# Patient Record
Sex: Female | Born: 1975 | Race: Black or African American | Hispanic: Yes | Smoking: Never smoker
Health system: Southern US, Community
[De-identification: ages and names within clinical notes are randomized; demographics above are authoritative.]

## PROBLEM LIST (undated history)

## (undated) DIAGNOSIS — Z803 Family history of malignant neoplasm of breast: Secondary | ICD-10-CM

## (undated) DIAGNOSIS — T7840XA Allergy, unspecified, initial encounter: Secondary | ICD-10-CM

## (undated) DIAGNOSIS — N6452 Nipple discharge: Secondary | ICD-10-CM

## (undated) DIAGNOSIS — Z8742 Personal history of other diseases of the female genital tract: Secondary | ICD-10-CM

## (undated) DIAGNOSIS — K219 Gastro-esophageal reflux disease without esophagitis: Secondary | ICD-10-CM

## (undated) DIAGNOSIS — N809 Endometriosis, unspecified: Secondary | ICD-10-CM

## (undated) DIAGNOSIS — E785 Hyperlipidemia, unspecified: Secondary | ICD-10-CM

## (undated) DIAGNOSIS — Z9109 Other allergy status, other than to drugs and biological substances: Secondary | ICD-10-CM

## (undated) HISTORY — DX: Family history of malignant neoplasm of breast: Z80.3

## (undated) HISTORY — DX: Allergy, unspecified, initial encounter: T78.40XA

## (undated) HISTORY — PX: BREAST SURGERY: SHX581

## (undated) HISTORY — DX: Gastro-esophageal reflux disease without esophagitis: K21.9

## (undated) HISTORY — DX: Other allergy status, other than to drugs and biological substances: Z91.09

## (undated) HISTORY — DX: Nipple discharge: N64.52

## (undated) HISTORY — DX: Hyperlipidemia, unspecified: E78.5

## (undated) HISTORY — DX: Personal history of other diseases of the female genital tract: Z87.42

## (undated) HISTORY — DX: Endometriosis, unspecified: N80.9

---

## 2004-06-03 ENCOUNTER — Inpatient Hospital Stay: Payer: Self-pay | Admitting: Obstetrics and Gynecology

## 2004-10-20 ENCOUNTER — Emergency Department: Payer: Self-pay | Admitting: Unknown Physician Specialty

## 2004-11-29 ENCOUNTER — Emergency Department: Payer: Self-pay | Admitting: Emergency Medicine

## 2006-05-14 ENCOUNTER — Emergency Department: Payer: Self-pay | Admitting: Emergency Medicine

## 2006-05-14 ENCOUNTER — Other Ambulatory Visit: Payer: Self-pay

## 2007-05-03 ENCOUNTER — Emergency Department: Payer: Self-pay | Admitting: Emergency Medicine

## 2007-08-03 ENCOUNTER — Emergency Department: Payer: Self-pay | Admitting: Emergency Medicine

## 2007-08-07 ENCOUNTER — Emergency Department: Payer: Self-pay | Admitting: Emergency Medicine

## 2007-09-14 ENCOUNTER — Emergency Department: Payer: Self-pay | Admitting: Emergency Medicine

## 2008-03-21 ENCOUNTER — Ambulatory Visit: Payer: Self-pay | Admitting: Internal Medicine

## 2008-05-01 ENCOUNTER — Ambulatory Visit: Payer: Self-pay | Admitting: Internal Medicine

## 2008-08-01 HISTORY — PX: BILATERAL SALPINGOOPHORECTOMY: SHX1223

## 2008-09-10 ENCOUNTER — Ambulatory Visit: Payer: Self-pay | Admitting: Internal Medicine

## 2009-07-15 ENCOUNTER — Ambulatory Visit: Payer: Self-pay

## 2009-07-16 ENCOUNTER — Ambulatory Visit: Payer: Self-pay

## 2009-07-16 HISTORY — PX: LAPAROSCOPIC SUPRACERVICAL HYSTERECTOMY: SUR797

## 2009-11-09 ENCOUNTER — Emergency Department: Payer: Self-pay | Admitting: Emergency Medicine

## 2009-11-11 ENCOUNTER — Emergency Department: Payer: Self-pay | Admitting: Emergency Medicine

## 2011-07-25 ENCOUNTER — Ambulatory Visit: Payer: Self-pay

## 2011-08-02 HISTORY — PX: COLPOSCOPY: SHX161

## 2011-10-14 DIAGNOSIS — Z8742 Personal history of other diseases of the female genital tract: Secondary | ICD-10-CM

## 2011-10-14 HISTORY — DX: Personal history of other diseases of the female genital tract: Z87.42

## 2012-11-26 ENCOUNTER — Ambulatory Visit: Payer: Self-pay

## 2012-12-31 ENCOUNTER — Ambulatory Visit: Payer: Self-pay | Admitting: General Surgery

## 2013-01-02 ENCOUNTER — Ambulatory Visit (INDEPENDENT_AMBULATORY_CARE_PROVIDER_SITE_OTHER): Payer: 59 | Admitting: General Surgery

## 2013-01-02 ENCOUNTER — Encounter: Payer: Self-pay | Admitting: General Surgery

## 2013-01-02 VITALS — BP 118/76 | HR 78 | Resp 12 | Ht 68.0 in | Wt 173.0 lb

## 2013-01-02 DIAGNOSIS — N6459 Other signs and symptoms in breast: Secondary | ICD-10-CM

## 2013-01-02 DIAGNOSIS — N6452 Nipple discharge: Secondary | ICD-10-CM

## 2013-01-02 NOTE — Patient Instructions (Signed)
Return as nneded.

## 2013-01-02 NOTE — Progress Notes (Signed)
Patient ID: Christine Fry, female   DOB: [birth year], 37 y.o.   MRN: [id number]  Chief Complaint  Patient presents with  . Other    mammogram    HPI Christine Fry is a 37 y.o. female here today for bilateral nipple discharge, has been going on for two months now. Patient states the discharge looks dark green.She had her mammogram and ultrasound done at [facility] on April 23,14. No family history of breast cancer. Patient performs self breast checks.  Drainage she's only been noted during manipulation with self-examination.  The patient reports no change in her diet or caffeine consumption. No report of generalized breast discomfort or engorgement. HPI  Past Medical History  Diagnosis Date  . Acid reflux   . Nipple discharge in female 01/03/2013    Past Surgical History  Procedure Laterality Date  . Abdominal hysterectomy  2009  . Cesarean section  2005    History reviewed. No pertinent family history.  Social History History  Substance Use Topics  . Smoking status: Never Smoker   . Smokeless tobacco: Never Used  . Alcohol Use: Yes    Allergies  Allergen Reactions  . Shellfish Allergy Nausea Only  . Sulfa Antibiotics Nausea And Vomiting  . Latex Rash    Current Outpatient Prescriptions  Medication Sig Dispense Refill  . cyclobenzaprine (FLEXERIL) 10 MG tablet Take 10 mg by mouth as needed for muscle spasms.      . fluticasone (VERAMYST) 27.5 MCG/SPRAY nasal spray Place 2 sprays into the nose as needed for rhinitis.      Marland Kitchen omeprazole (PRILOSEC) 20 MG capsule Take 20 mg by mouth as needed.       No current facility-administered medications for this visit.    Review of Systems Review of Systems  Constitutional: Negative.   Respiratory: Negative.   Cardiovascular: Negative.     Blood pressure 118/76, pulse 78, resp. rate 12, height 5\' 8"  (1.727 m), weight 173 lb (78.472 kg).  Physical Exam Physical Exam  Constitutional: She appears well-developed and  well-nourished.  Eyes: Conjunctivae are normal. No scleral icterus.  Neck: Neck supple.  Cardiovascular: Normal rate, regular rhythm and normal heart sounds.   Pulmonary/Chest: Effort normal. Right breast exhibits no inverted nipple, no mass, no nipple discharge, no skin change and no tenderness. Left breast exhibits no inverted nipple, no mass, no nipple discharge, no skin change and no tenderness.  left breast Thickening at 6 o'clock  Lymphadenopathy:    She has no cervical adenopathy.    She has no axillary adenopathy.    Data Reviewed Bilateral mammograms dated 11/26/2012 showed heterogeneously dense parenchymal pattern. No dominant masses or areas of architectural distortion. Ultrasound examination completed at the same time showed no cystic or solid masses in the retroareolar area nor evidence of ductal dilatation. BI-RAD-2.  Focused ultrasound examination of the area of focal thickening in the inferior aspect the left breast was completed. No discernible architectural distortion, cystic or solid areas nor areas of hyperechoic tissue was appreciated. (No images, no charge).  Laboratory studies dated 11/26/2012 showed a normal comprehensive metabolic panel, low TSH of 0.342, low white blood cell count 3200, hemoglobin of 12.7, normal T4 and T3, data urinalysis the  Assessment    Physiologic nipple discharge.    Plan    The patient was discouraged from manipulation of the nipples during her monthly self-exam. Should she have any spontaneous drainage she was encouraged to call for assessment.  Christine Fry 01/03/2013, 11:23 AM

## 2013-01-03 ENCOUNTER — Encounter: Payer: Self-pay | Admitting: General Surgery

## 2013-01-03 DIAGNOSIS — N6452 Nipple discharge: Secondary | ICD-10-CM

## 2013-01-03 HISTORY — DX: Nipple discharge: N64.52

## 2013-01-30 ENCOUNTER — Ambulatory Visit: Payer: Self-pay | Admitting: General Surgery

## 2014-01-20 ENCOUNTER — Ambulatory Visit: Payer: Self-pay

## 2014-06-02 ENCOUNTER — Encounter: Payer: Self-pay | Admitting: General Surgery

## 2014-10-15 ENCOUNTER — Ambulatory Visit: Payer: Self-pay | Admitting: Nurse Practitioner

## 2014-10-29 ENCOUNTER — Ambulatory Visit (INDEPENDENT_AMBULATORY_CARE_PROVIDER_SITE_OTHER): Payer: 59 | Admitting: General Surgery

## 2014-10-29 ENCOUNTER — Encounter: Payer: Self-pay | Admitting: General Surgery

## 2014-10-29 VITALS — BP 118/72 | HR 84 | Resp 16 | Ht 68.0 in | Wt 176.0 lb

## 2014-10-29 DIAGNOSIS — N6452 Nipple discharge: Secondary | ICD-10-CM | POA: Diagnosis not present

## 2014-10-29 NOTE — Progress Notes (Signed)
Patient ID: Christine Fry, female   DOB: 21-Nov-1975, 39 y.o.   MRN: [id number]  Chief Complaint  Patient presents with  . Other    mammogram and breast pain    HPI Christine Fry is a 39 y.o. female.  who presents for a breast evaluation. The most recent mammogram was done on 10-15-14.  She was seen in June, 2014 for the same situation. Nipple discharge from right breast is clear and left breast is dark color. Some tenderness and "full" feeling. Occasionally breast pain in the left breast that is described as sharpe and brief.    No family history of breast cancer. Patient performs self breast checks. Drainage she's only been noted during manipulation with self-examination.    HPI  Past Medical History  Diagnosis Date  . Acid reflux   . Nipple discharge in female 01/03/2013    Past Surgical History  Procedure Laterality Date  . Abdominal hysterectomy  2009  . Cesarean section  2005    No family history on file.  Social History History  Substance Use Topics  . Smoking status: Never Smoker   . Smokeless tobacco: Never Used  . Alcohol Use: Yes    Allergies  Allergen Reactions  . Shellfish Allergy Nausea Only  . Sulfa Antibiotics Nausea And Vomiting  . Latex Rash    Current Outpatient Prescriptions  Medication Sig Dispense Refill  . cyclobenzaprine (FLEXERIL) 10 MG tablet Take 10 mg by mouth as needed for muscle spasms.    . fluticasone (VERAMYST) 27.5 MCG/SPRAY nasal spray Place 2 sprays into the nose as needed for rhinitis.    Marland Kitchen omeprazole (PRILOSEC) 20 MG capsule Take 20 mg by mouth as needed.     No current facility-administered medications for this visit.    Review of Systems Review of Systems  Constitutional: Negative.   Respiratory: Negative.   Cardiovascular: Negative.     Blood pressure 118/72, pulse 84, resp. rate 16, height  (1.727 m), weight 176 lb (79.833 kg).  Physical Exam Physical Exam  Constitutional: She is oriented to person, place, and  time. She appears well-developed and well-nourished.  Eyes: Conjunctivae are normal. No scleral icterus.  Neck: Neck supple.  Cardiovascular: Normal rate, regular rhythm and normal heart sounds.   Pulmonary/Chest: Effort normal and breath sounds normal. Right breast exhibits nipple discharge ( single duct drainage clean). Right breast exhibits no inverted nipple, no mass, no skin change and no tenderness. Left breast exhibits nipple discharge ( single area 1'oclock at the base on the nipple. single area on nipple dark green ). Left breast exhibits no inverted nipple, no mass, no skin change and no tenderness. Breasts are asymmetrical (right breast 1/2 cup size bigger than left breast).    Lymphadenopathy:    She has no cervical adenopathy.    She has no axillary adenopathy.  Neurological: She is alert and oriented to person, place, and time.  Skin: Skin is warm and dry.    Data Reviewed Mammogram dated 10/15/2014 was reviewed. No mammographic abnormality appreciated. BI-RADS-1. The radiologist describes eliciting green drainage from both nipples. This was not the case on today's exam nor on past exams in this office.  PCP notes of March 7 and 22, 2016 were reviewed. She was treated with a course of oral Keflex at the time of the 12/06/2014 visit.  Assessment    Symptomatic left nipple drainage, physiologic. Scant right nipple drainage not requiring intervention.    Plan    Discussed  left breast duct removal. The potential for numbness in the nipple as well as loss of normal erectile function was reviewed. Options for management of the drainage include continued observation versus duct excision. Excision would be completed under anesthesia as an outpatient. The patient desires to proceed with excision.  We'll plan to remove the area of drainage at the base the nipple at the 1:00 position as well as the offending duct.  Patient's surgery has been scheduled for 11-14-14 at [facility].     PCP:   Clyda HurdleKhan, Fozia M   Jd Mccaster W 10/31/2014, 11:55 AM

## 2014-10-29 NOTE — Patient Instructions (Addendum)
Patient to have a left breast duct removal.  Patient's surgery has [facility] for [facility].

## 2014-10-31 ENCOUNTER — Other Ambulatory Visit: Payer: Self-pay | Admitting: General Surgery

## 2014-10-31 DIAGNOSIS — N6452 Nipple discharge: Secondary | ICD-10-CM

## 2014-11-14 ENCOUNTER — Ambulatory Visit
Admit: 2014-11-14 | Disposition: A | Discharge status: Home / Self Care | Payer: Self-pay | Attending: General Surgery | Admitting: General Surgery

## 2014-11-14 DIAGNOSIS — N63 Unspecified lump in breast: Secondary | ICD-10-CM | POA: Diagnosis not present

## 2014-11-14 DIAGNOSIS — N6452 Nipple discharge: Secondary | ICD-10-CM | POA: Diagnosis not present

## 2014-11-14 HISTORY — PX: BREAST SURGERY: SHX581

## 2014-11-17 ENCOUNTER — Encounter: Payer: Self-pay | Admitting: General Surgery

## 2014-11-18 ENCOUNTER — Telehealth: Payer: Self-pay | Admitting: General Surgery

## 2014-11-18 ENCOUNTER — Encounter: Payer: Self-pay | Admitting: General Surgery

## 2014-11-18 NOTE — Telephone Encounter (Signed)
Notified path benign. 

## 2014-11-24 ENCOUNTER — Encounter: Payer: Self-pay | Admitting: General Surgery

## 2014-11-24 ENCOUNTER — Ambulatory Visit (INDEPENDENT_AMBULATORY_CARE_PROVIDER_SITE_OTHER): Payer: 59 | Admitting: General Surgery

## 2014-11-24 VITALS — BP 130/80 | HR 77 | Resp 14 | Ht 68.0 in | Wt 173.0 lb

## 2014-11-24 DIAGNOSIS — N6452 Nipple discharge: Secondary | ICD-10-CM

## 2014-11-24 LAB — SURGICAL PATHOLOGY

## 2014-11-24 NOTE — Patient Instructions (Signed)
May use heat for comfort.

## 2014-11-24 NOTE — Progress Notes (Signed)
Patient ID: Christine Fry, female   DOB: 1975/12/22, 39 y.o.   MRN: [id number]  Chief Complaint  Patient presents with  . Routine Post Op    left breast excision    HPI Christine Fry is a 39 y.o. female here today for her post op left breast excision done on 11/14/14. Patient states she is doing well but having pulsing pain at times and she has been using Ibuprofen for this.  HPI  Past Medical History  Diagnosis Date  . Acid reflux   . Nipple discharge in female 01/03/2013    Past Surgical History  Procedure Laterality Date  . Abdominal hysterectomy  2009  . Cesarean section  2005  . Breast surgery Left 11/14/14    excision    No family history on file.  Social History History  Substance Use Topics  . Smoking status: Never Smoker   . Smokeless tobacco: Never Used  . Alcohol Use: Yes    Allergies  Allergen Reactions  . Shellfish Allergy Nausea Only  . Sulfa Antibiotics Nausea And Vomiting  . Latex Rash    Current Outpatient Prescriptions  Medication Sig Dispense Refill  . cyclobenzaprine (FLEXERIL) 10 MG tablet Take 10 mg by mouth as needed for muscle spasms.    . fluticasone (VERAMYST) 27.5 MCG/SPRAY nasal spray Place 2 sprays into the nose as needed for rhinitis.    Marland Kitchen. omeprazole (PRILOSEC) 20 MG capsule Take 20 mg by mouth as needed.     No current facility-administered medications for this visit.    Review of Systems Review of Systems  Constitutional: Negative.   Respiratory: Negative.   Cardiovascular: Negative.     Blood pressure 130/80, pulse 77, resp. rate 14, height 5\' 8"  (1.727 m), weight 173 lb (78.472 kg).  Physical Exam Physical Exam  Constitutional: She is oriented to person, place, and time. She appears well-developed and well-nourished.  Neck: Neck supple.  Cardiovascular: Normal rate and normal heart sounds.   Pulmonary/Chest: Effort normal and breath sounds normal.    Lymphadenopathy:    She has no cervical adenopathy.  Neurological:  She is alert and oriented to person, place, and time.  Skin: Skin is warm and dry.    Data Reviewed Pathology of the resected retroareolar ductal tissue showed benign breast tissue with fibrocystic changes. Clinically noted were multiple dilated ducts up to a millimeter in diameter.  The skin cyst from the areola showed a sebaceous gland with mild dermal chronic inflammation. No cyst was identified. No malignancy.  Assessment    Doing well status post excision of the left breast retroareolar ductal structures for chronic drainage. Mild firmness consistent with surgical dissection. Good preservation of nipple height.    Plan    Local heat to help resolve the residual thickening has been recommended. She is encouraged to wear a bra at all times until the breast no longer feels "heavy". We'll defer decision on contralateral breast duct excision until a follow-up exam in 2 months. He may resume her exercise program as she is comfortable.     PCP:  Clyda HurdleKhan, Fozia M  Lilias Lorensen W 11/24/2014, 8:51 PM

## 2014-11-30 NOTE — Op Note (Signed)
PATIENT NAME:  Christine Fry, Christine Fry MR#:  [id number] DATE OF BIRTH:  Sep 16, 1975  DATE OF PROCEDURE:  11/14/2014  PREOPERATIVE DIAGNOSIS: Left breast nipple drainage, drainage from accessory gland of Montgomery of the left areola.   POSTOPERATIVE DIAGNOSIS: Left breast nipple drainage, drainage from accessory gland of Montgomery of the left areola.   OPERATIVE PROCEDURE: 1.  Excision of left retroareolar ductal tissue.  2.  Excision of left accessory gland of Montgomery at the 1 o'clock position of the left areola.   SURGEON: Earline MayotteJeffrey W. Yarima Penman, MD    ANESTHESIA: General by LMA, Marcaine 0.5% with 1:200,000 units of epinephrine, 30 mL local infiltration.   CLINICAL NOTE: This 39 year old woman has had intermittent drainage from the left breast which she has found troublesome. Examination showed green drainage from the nipple at the 4 o'clock position. Also noted was drainage from an inflamed superficial skin lesion thought to represent an accessory gland of Montgomery. She was admitted for elective excision of the retroareolar ductal tissue. The patient received Kefzol prior to the procedure.   OPERATIVE NOTE: With the patient under adequate general anesthesia, the breast was prepped with ChloraPrep and draped. Marcaine was infiltrated for postoperative analgesia. An incision was made from the 3 to 9 o'clock position along the edge of the areola. The skin was incised sharply and the remaining dissection completed with electrocautery. The retroareolar tissue was removed including that in the base of the nipple. This was tagged with a skin marker. The dissection included a 3 x 3 x 3 cm block of tissue, which showed chronic inflammatory changes. Several fairly prominent ducts of 2 to 3 mm were noted. No dilated ducts were appreciated at the level of resection. The deep tissue was approximated with interrupted 2-0 Vicryl figure-of-eight sutures in multiple layers. This was done to reconstruct the retroareolar  breast volume.   The area of drainage from the glandular tissue on the surface of the areola was excised with an elliptical incision. This was sent for routine histology. This defect was closed with interrupted 5-0 nylon sutures. The skin of the primary incision for retroareolar tissue excision was closed with a running 4-0 Vicryl subcuticular suture. Benzoin and Steri-Strips were applied. The breast was carefully padded to minimize pressure on the nipple and a compressive wrap applied. The patient tolerated the procedure well and was taken to the recovery room in stable condition.    ____________________________ Earline MayotteJeffrey W. Keldric Poyer, MD jwb:at D: 11/15/2014 09:29:00 ET T: 11/15/2014 14:16:59 ET JOB#: [id number]  cc: Earline MayotteJeffrey W. Chrysta Fulcher, MD, <Dictator> Oliver HumHeather Hiles, NP Omkar Stratmann Brion AlimentW Punam Broussard MD ELECTRONICALLY SIGNED 11/17/2014 9:52

## 2015-01-02 ENCOUNTER — Telehealth: Payer: Self-pay | Admitting: *Deleted

## 2015-01-02 NOTE — Telephone Encounter (Signed)
Patient had surgery on 11/14/14 and her LT Breast still has a little pain. She described it as a "heartbeat" and it comes and goes. She was wanting to know if this was normal after surgery.

## 2015-01-02 NOTE — Telephone Encounter (Signed)
Please notify the patient this is normal. She should be having a follow-up visit in the next 2-4 weeks we'll review at that time.

## 2015-01-27 ENCOUNTER — Ambulatory Visit (INDEPENDENT_AMBULATORY_CARE_PROVIDER_SITE_OTHER): Payer: 59 | Admitting: General Surgery

## 2015-01-27 ENCOUNTER — Encounter: Payer: Self-pay | Admitting: General Surgery

## 2015-01-27 VITALS — BP 128/74 | HR 80 | Resp 12 | Ht 68.0 in | Wt 179.0 lb

## 2015-01-27 DIAGNOSIS — N6452 Nipple discharge: Secondary | ICD-10-CM

## 2015-01-27 NOTE — Progress Notes (Signed)
Patient ID: Christine Fry, female   DOB: 12/14/1975, 39 y.o.   MRN: [id number]  Chief Complaint  Patient presents with  . Follow-up    left breast excision     HPI Christine Fry is a 39 y.o. female here today for her two month follow up left breast excision done on 11/14/14. Patient states she is doing well.  HPI  Past Medical History  Diagnosis Date  . Acid reflux   . Nipple discharge in female 01/03/2013    Past Surgical History  Procedure Laterality Date  . Abdominal hysterectomy  2009  . Cesarean section  2005  . Breast surgery Left 11/14/14    excision    No family history on file.  Social History History  Substance Use Topics  . Smoking status: Never Smoker   . Smokeless tobacco: Never Used  . Alcohol Use: Yes    Allergies  Allergen Reactions  . Shellfish Allergy Nausea Only  . Sulfa Antibiotics Nausea And Vomiting  . Latex Rash    Current Outpatient Prescriptions  Medication Sig Dispense Refill  . cyclobenzaprine (FLEXERIL) 10 MG tablet Take 10 mg by mouth as needed for muscle spasms.     No current facility-administered medications for this visit.    Review of Systems Review of Systems  Constitutional: Negative.   Respiratory: Negative.   Cardiovascular: Negative.     Blood pressure 128/74, pulse 80, resp. rate 12, height 5\' 8"  (1.727 m), weight 179 lb (81.194 kg).  Physical Exam Physical Exam  Constitutional: She is oriented to person, place, and time. She appears well-developed and well-nourished.  HENT:  Mouth/Throat: Oropharynx is clear and moist.  Pulmonary/Chest:    Minimal nipple height loss. Well healed incision.  Neurological: She is alert and oriented to person, place, and time.  Skin: Skin is warm and dry.    Data Reviewed Pathology of the resected tissue showed fibrocystic changes without evidence of malignancy.  Assessment    Doing well status post excision of retroareolar ductal tissue. No recurrent drainage.    Plan     The patient will notify the office of any concerns.     Follow up as needed. Continue self breast exams. Call office for any new breast issues or concerns.  PCP:  Clyda HurdleKhan, Fozia M  Marwin Primmer W 01/28/2015, 7:09 PM

## 2015-01-27 NOTE — Patient Instructions (Signed)
Continue self breast exams. Call office for any new breast issues or concerns. 

## 2015-07-19 ENCOUNTER — Emergency Department
Admission: EM | Admit: 2015-07-19 | Discharge: 2015-07-19 | Disposition: A | Discharge status: Home / Self Care | Payer: 59 | Attending: Student | Admitting: Student

## 2015-07-19 ENCOUNTER — Encounter: Payer: Self-pay | Admitting: Emergency Medicine

## 2015-07-19 DIAGNOSIS — Z91040 Latex allergy status: Secondary | ICD-10-CM | POA: Insufficient documentation

## 2015-07-19 DIAGNOSIS — K0889 Other specified disorders of teeth and supporting structures: Secondary | ICD-10-CM | POA: Diagnosis not present

## 2015-07-19 MED ORDER — HYDROCODONE-ACETAMINOPHEN 5-325 MG PO TABS
1.0000 | ORAL_TABLET | ORAL | Status: DC | PRN
Start: 2015-07-19 — End: 2017-10-06

## 2015-07-19 MED ORDER — AMOXICILLIN 500 MG PO TABS: 500.0000 mg | ORAL_TABLET | Freq: Three times a day (TID) | ORAL | Status: DC

## 2015-07-19 MED ORDER — ONDANSETRON HCL 4 MG PO TABS: 4.0000 mg | ORAL_TABLET | Freq: Three times a day (TID) | ORAL | Status: DC | PRN

## 2015-07-19 NOTE — ED Notes (Signed)
Pt presents to Ed with dental pain on left side. Pt states " I can't wait until Tuesday" to see her dentist.

## 2015-07-19 NOTE — ED Provider Notes (Signed)
[facility] Emergency Department Provider Note ____________________________________________  Time seen: Approximately 10:52 AM  I have reviewed the triage vital signs and the nursing notes.   HISTORY  Chief Complaint Dental Pain   HPI Christine Fry is a 39 y.o. female who presents to the emergency department for evaluation of dental pain. She states that she had a crown placed on her left lower molar in March. The past few days she has had increasing pain in the gum and left lower jaw, but doesn't see any crack or chip on the crown. She states that her dentist isn't open until Tuesday and the pain is too intense to wait. She has been taking Advil without relief.   Past Medical History  Diagnosis Date  . Acid reflux   . Nipple discharge in female 01/03/2013    Patient Active Problem List   Diagnosis Date Noted  . Nipple discharge in female 01/03/2013    Past Surgical History  Procedure Laterality Date  . Abdominal hysterectomy  2009  . Cesarean section  2005  . Breast surgery Left 11/14/14    excision    Current Outpatient Rx  Name  Route  Sig  Dispense  Refill  . amoxicillin (AMOXIL) 500 MG tablet   Oral   Take 1 tablet (500 mg total) by mouth 3 (three) times daily.   30 tablet   0   . cyclobenzaprine (FLEXERIL) 10 MG tablet   Oral   Take 10 mg by mouth as needed for muscle spasms.         Marland Kitchen. HYDROcodone-acetaminophen (NORCO/VICODIN) 5-325 MG tablet   Oral   Take 1 tablet by mouth every 4 (four) hours as needed.   8 tablet   0     Allergies Shellfish allergy; Sulfa antibiotics; and Latex  History reviewed. No pertinent family history.  Social History Social History  Substance Use Topics  . Smoking status: Never Smoker   . Smokeless tobacco: Never Used  . Alcohol Use: Yes    Review of Systems Constitutional: No fever/chills Eyes: No visual changes. ENT: No sore throat. Cardiovascular: Denies chest pain. Respiratory:  Denies shortness of breath. Gastrointestinal: No abdominal pain.  No nausea, no vomiting.  Genitourinary: Negative for dysuria. Musculoskeletal: Negative for back pain. Skin: Negative for rash. Neurological: Negative for headaches, focal weakness or numbness. 10-point ROS otherwise negative.  ____________________________________________   PHYSICAL EXAM:  VITAL SIGNS: ED Triage Vitals  Enc Vitals Group     BP 07/19/15 1009 128/71 mmHg     Pulse Rate 07/19/15 1009 100     Resp 07/19/15 1009 18     Temp 07/19/15 1009 98.5 F (36.9 C)     Temp Source 07/19/15 1009 Oral     SpO2 07/19/15 1009 99 %     Weight 07/19/15 1009 172 lb (78.019 kg)     Height 07/19/15 1009 5\' 6"  (1.676 m)     Head Cir --      Peak Flow --      Pain Score 07/19/15 1010 10     Pain Loc --      Pain Edu? --      Excl. in GC? --     Constitutional: Alert and oriented. Well appearing and in no acute distress. Eyes: Conjunctivae are normal. PERRL. EOMI. Head: Atraumatic. Nose: No congestion/rhinnorhea. Mouth/Throat: Mucous membranes are moist.  Oropharynx non-erythematous. Periodontal Exam    Neck: No stridor.  Hematological/Lymphatic/Immunilogical: No cervical lymphadenopathy. Cardiovascular:   Good  peripheral circulation. Respiratory: Normal respiratory effort.  No retractions. Musculoskeletal: No lower extremity tenderness nor edema.  No joint effusions. Neurologic:  Normal speech and language. No gross focal neurologic deficits are appreciated. Speech is normal. No gait instability. Skin:  Skin is warm, dry and intact. No rash noted. Psychiatric: Mood and affect are normal. Speech and behavior are normal.  ____________________________________________   LABS (all labs ordered are listed, but only abnormal results are displayed)  Labs Reviewed - No data to display ____________________________________________   RADIOLOGY  Not  indicated ____________________________________________   PROCEDURES  Procedure(s) performed: None  Critical Care performed: No  ____________________________________________   INITIAL IMPRESSION / ASSESSMENT AND PLAN / ED COURSE  Pertinent labs & imaging results that were available during my care of the patient were reviewed by me and considered in my medical decision making (see chart for details).  Patient was advised to see the dentist within 14 days. Also advised to take the antibiotic until finished. Instructed to return to the ER for symptoms that change or worsen if you are unable to schedule an appointment. ____________________________________________   FINAL CLINICAL IMPRESSION(S) / ED DIAGNOSES  Final diagnoses:  Pain, dental       Chinita Pester, FNP 07/19/15 1129  Gayla Doss, MD 07/19/15 1616

## 2015-07-19 NOTE — ED Notes (Signed)
Pt reports that she has dental pain that is too severe to wait for her dentist to open on Tuesday. Pt reports that she had a crown placed on that tooth in March, and doesn't think she has a cavity in it. Pt alert & oriented with NAD noted.

## 2016-07-10 ENCOUNTER — Emergency Department
Admission: EM | Admit: 2016-07-10 | Discharge: 2016-07-10 | Disposition: A | Discharge status: Home / Self Care | Payer: 59 | Attending: Emergency Medicine | Admitting: Emergency Medicine

## 2016-07-10 ENCOUNTER — Emergency Department: Payer: 59

## 2016-07-10 ENCOUNTER — Encounter: Payer: Self-pay | Admitting: Medical Oncology

## 2016-07-10 DIAGNOSIS — Z91040 Latex allergy status: Secondary | ICD-10-CM | POA: Diagnosis not present

## 2016-07-10 DIAGNOSIS — Y92009 Unspecified place in unspecified non-institutional (private) residence as the place of occurrence of the external cause: Secondary | ICD-10-CM | POA: Insufficient documentation

## 2016-07-10 DIAGNOSIS — W001XXA Fall from stairs and steps due to ice and snow, initial encounter: Secondary | ICD-10-CM | POA: Diagnosis not present

## 2016-07-10 DIAGNOSIS — M79602 Pain in left arm: Secondary | ICD-10-CM | POA: Diagnosis not present

## 2016-07-10 DIAGNOSIS — W19XXXA Unspecified fall, initial encounter: Secondary | ICD-10-CM

## 2016-07-10 DIAGNOSIS — S4992XA Unspecified injury of left shoulder and upper arm, initial encounter: Secondary | ICD-10-CM | POA: Diagnosis present

## 2016-07-10 DIAGNOSIS — Y9389 Activity, other specified: Secondary | ICD-10-CM | POA: Diagnosis not present

## 2016-07-10 DIAGNOSIS — Y999 Unspecified external cause status: Secondary | ICD-10-CM | POA: Insufficient documentation

## 2016-07-10 MED ORDER — MELOXICAM 7.5 MG PO TABS: 7.5000 mg | ORAL_TABLET | Freq: Every day | ORAL | 2 refills | Status: AC

## 2016-07-10 NOTE — ED Provider Notes (Signed)
[facility] Emergency Department Provider Note ____________________________________________  Time seen: Approximately 9:26 AM  I have reviewed the triage vital signs and the nursing notes.   HISTORY  Chief Complaint Fall and Arm Pain    HPI Christine Fry is a 40 y.o. female presenting with left elbow pain. Patient states that she tripped and slipped on ice last night and fell on her left elbow. Patient did not hit her head or lose consciousness. Patient states that she experiences pain with supination. She currently rates acute, aching pain at 8/10 in intensity. She has taken ibuprofen and applied ice, which has provided limited relief. She is presenting to the emergency department to rule out possible fractures. She denies prior incidences of trauma or surgeries to the left upper extremity.  Past Medical History:  Diagnosis Date  . Acid reflux   . Nipple discharge in female 01/03/2013    Patient Active Problem List   Diagnosis Date Noted  . Nipple discharge in female 01/03/2013    Past Surgical History:  Procedure Laterality Date  . ABDOMINAL HYSTERECTOMY  2009  . BREAST SURGERY Left 11/14/14   excision  . CESAREAN SECTION  2005    Prior to Admission medications   Medication Sig Start Date End Date Taking? Authorizing Provider  amoxicillin (AMOXIL) 500 MG tablet Take 1 tablet (500 mg total) by mouth 3 (three) times daily. 07/19/15   Chinita Pesterari B Triplett, FNP  cyclobenzaprine (FLEXERIL) 10 MG tablet Take 10 mg by mouth as needed for muscle spasms.    Historical Provider, MD  HYDROcodone-acetaminophen (NORCO/VICODIN) 5-325 MG tablet Take 1 tablet by mouth every 4 (four) hours as needed. 07/19/15   Chinita Pesterari B Triplett, FNP  meloxicam (MOBIC) 7.5 MG tablet Take 1 tablet (7.5 mg total) by mouth daily. 07/10/16 07/10/17  Orvil FeilJaclyn M Arianie Couse, PA-C  ondansetron (ZOFRAN) 4 MG tablet Take 1 tablet (4 mg total) by mouth every 8 (eight) hours as needed for nausea or vomiting.  07/19/15   Chinita Pesterari B Triplett, FNP    Allergies Shellfish allergy; Sulfa antibiotics; and Latex  No family history on file.  Social History Social History  Substance Use Topics  . Smoking status: Never Smoker  . Smokeless tobacco: Never Used  . Alcohol use Yes    Review of Systems Constitutional: No recent illness. Cardiovascular: Denies chest pain or palpitations. Respiratory: Denies shortness of breath. Musculoskeletal: Pain in left arm.  Skin: Negative for rash, wound, lesion. Neurological: Negative for focal weakness or numbness.  ____________________________________________   PHYSICAL EXAM:  VITAL SIGNS: ED Triage Vitals  Enc Vitals Group     BP 07/10/16 0901 103/65     Pulse Rate 07/10/16 0901 84     Resp 07/10/16 0901 17     Temp 07/10/16 0901 98 F (36.7 C)     Temp Source 07/10/16 0901 Oral     SpO2 07/10/16 0901 100 %     Weight 07/10/16 0853 172 lb (78 kg)     Height --      Head Circumference --      Peak Flow --      Pain Score 07/10/16 0853 8     Pain Loc --      Pain Edu? --      Excl. in GC? --     Constitutional: Alert and oriented. Well appearing and in no acute distress. Head: Atraumatic. Neck: No stridor.  Respiratory: Normal respiratory effort.   Musculoskeletal: Patient has 5/5 strength in the  upper extremities bilaterally. Full range of motion at the shoulder, elbow and wrist bilaterally. Reflexes are 2+ and symmetric. Palpable radial and ulnar pulses bilaterally.  Neurologic:  Normal speech and language. No gross focal neurologic deficits are appreciated. Speech is normal. No gait instability. Skin:  Skin is warm, dry and intact. Atraumatic. No bruising visualized. Psychiatric: Mood and affect are normal. Speech and behavior are normal.  ____________________________________________   LABS (all labs ordered are listed, but only abnormal results are displayed)  Labs Reviewed - No data to  display ____________________________________________  RADIOLOGY  DG Elbow Complete   I, Orvil FeilJaclyn M Arcadio Cope, personally viewed and evaluated these images (plain radiographs) as part of my medical decision making, as well as reviewing the written report by the radiologist.  No fractures or dislocations visualized.   PROCEDURES  Procedure(s) performed: None     INITIAL IMPRESSION / ASSESSMENT AND PLAN / ED COURSE  Clinical Course     Pertinent labs & imaging results that were available during my care of the patient were reviewed by me and considered in my medical decision making (see chart for details).  Assessment and Plan  Patient presents with left arm pain after slipping and falling on her left elbow last night. Musculoskeletal and neurologic physical exam components were within the parameters are normal. Patient underwent 2 physical exams during his emergency department encounter. Both physical exams were consistent. DG elbow complete reveals no fractures or dislocations. Rest, ice, and compression were encouraged. Patient was prescribed Mobic to be used as needed for pain and inflammation. A referral was made to the orthopedist on-call, Dr. Martha ClanKrasinski. She was advised to make an appointment if left elbow pain persists. All patient questions were answered.  FINAL CLINICAL IMPRESSION(S) / ED DIAGNOSES  Final diagnoses:  Fall, initial encounter       Orvil FeilJaclyn M Krystie Leiter, PA-C 07/10/16 1113    Sharyn CreamerMark Quale, MD 07/10/16 1426

## 2016-07-10 NOTE — ED Notes (Signed)
See triage note  States she fell last pm while taking care of son  Landed with left arm hyperextended   Min swelling noted to lateral elbow

## 2016-07-10 NOTE — ED Triage Notes (Signed)
Pt slipped on ice and fell last night, c/o left arm pain.

## 2017-10-06 ENCOUNTER — Ambulatory Visit (INDEPENDENT_AMBULATORY_CARE_PROVIDER_SITE_OTHER): Payer: 59 | Admitting: Certified Nurse Midwife

## 2017-10-06 ENCOUNTER — Encounter: Payer: Self-pay | Admitting: Certified Nurse Midwife

## 2017-10-06 VITALS — BP 110/66 | HR 81 | Ht 68.0 in | Wt 183.0 lb

## 2017-10-06 DIAGNOSIS — Z13220 Encounter for screening for lipoid disorders: Secondary | ICD-10-CM

## 2017-10-06 DIAGNOSIS — Z124 Encounter for screening for malignant neoplasm of cervix: Secondary | ICD-10-CM

## 2017-10-06 DIAGNOSIS — R61 Generalized hyperhidrosis: Secondary | ICD-10-CM

## 2017-10-06 DIAGNOSIS — E8941 Symptomatic postprocedural ovarian failure: Secondary | ICD-10-CM

## 2017-10-06 DIAGNOSIS — N941 Unspecified dyspareunia: Secondary | ICD-10-CM | POA: Diagnosis not present

## 2017-10-06 DIAGNOSIS — Z1231 Encounter for screening mammogram for malignant neoplasm of breast: Secondary | ICD-10-CM

## 2017-10-06 DIAGNOSIS — R3915 Urgency of urination: Secondary | ICD-10-CM | POA: Diagnosis not present

## 2017-10-06 DIAGNOSIS — Z87898 Personal history of other specified conditions: Secondary | ICD-10-CM | POA: Diagnosis not present

## 2017-10-06 DIAGNOSIS — Z8742 Personal history of other diseases of the female genital tract: Secondary | ICD-10-CM

## 2017-10-06 DIAGNOSIS — Z131 Encounter for screening for diabetes mellitus: Secondary | ICD-10-CM | POA: Diagnosis not present

## 2017-10-06 DIAGNOSIS — Z1239 Encounter for other screening for malignant neoplasm of breast: Secondary | ICD-10-CM

## 2017-10-06 LAB — POCT URINALYSIS DIPSTICK
Bilirubin, UA: POSITIVE
Blood, UA: NEGATIVE
GLUCOSE UA: NEGATIVE
Leukocytes, UA: NEGATIVE
NITRITE UA: NEGATIVE
SPEC GRAV UA: 1.01 (ref 1.010–1.025)
Urobilinogen, UA: 0.2 E.U./dL
pH, UA: 7.5 (ref 5.0–8.0)

## 2017-10-06 MED ORDER — ESTRADIOL 10 MCG VA INST: 1.0000 | VAGINAL_INSERT | Freq: Every evening | VAGINAL | 2 refills | Status: AC

## 2017-10-06 NOTE — Progress Notes (Signed)
Gynecology Annual Exam  PCP: Lyndon CodeKhan, Fozia M, MD  Chief Complaint:  Chief Complaint  Patient presents with  . Gynecologic Exam    History of Present Illness: Christine Fry is a 42 y.o. G2P2 who presents for her annual NP gyn exam. The patient complains of vaginal dryness and dyspareunia. Does not use lubricants. She also complains of urinary urgency x 1 year, worsening recently. "When I feel the urge to go, I got to go." Denies urinary frequency. Urinates 3 times on a 12 hour shift. Drinks one cup of coffee a day and 6-7 bottles of water a day. She [facility] for [facility]. Has never been on HT  [facility]. Last Pap smear 04/21/2015 was negative. Hx of abnormal Pap smears in 2013 (LGSIL). Coloposcopy in 2013 (normal or CINi), no biopsies.  [facility] , she has had a left breast biopsy 11/14/2014. Path returned fibrocystic changes.   Her past medical history is remarkable for endometriosis, myomas, menorrhagia, and allergies.  The patient does perform occasional self breast exams. Her last mammogram was 10/15/2014, results were negative.   There is a family history of breast cancer in her paternal aunt. Genetic testing not been done.   There is no family history of ovarian cancer. Genetic testing  been done.  The patient denies smoking.  She reports drinking alcohol occasionally.   She denies illegal drug use.  The patient reports exercising regularly.  The patient denies current symptoms of depression.    Review of Systems: Review of Systems  Constitutional: Negative for chills, fever and weight loss.  HENT: Negative for congestion, sinus pain and sore throat.   Eyes: Negative for blurred vision and pain.  Respiratory: Negative for hemoptysis, shortness of breath and wheezing.   Cardiovascular: Negative for chest pain, palpitations and leg swelling.  Gastrointestinal:  Negative for abdominal pain, blood in stool, diarrhea, heartburn, nausea and vomiting.  Genitourinary: Positive for urgency. Negative for dysuria, frequency and hematuria.       Positive for vaginal dryness and dyspareunia  Musculoskeletal: Negative for back pain, joint pain and myalgias.  Skin: Positive for itching (generalized pruritis x 1 month.). Negative for rash.  Neurological: Negative for dizziness, tingling and headaches.  Endo/Heme/Allergies: Positive for environmental allergies. Negative for polydipsia. Does not bruise/bleed easily.       Negative for hirsutism   Psychiatric/Behavioral: Negative for depression. The patient is not nervous/anxious and does not have insomnia.     Past Medical History:  Past Medical History:  Diagnosis Date  . Acid reflux   . Endometriosis   . Environmental allergies   . History of abnormal cervical Pap smear 10/14/2011   LGSIL  . Nipple discharge in female 01/03/2013    Past Surgical History:  Past Surgical History:  Procedure Laterality Date  . BILATERAL SALPINGOOPHORECTOMY  2010   [facility]  . BREAST SURGERY Left 11/14/14   excision  . CESAREAN SECTION  2005  . COLPOSCOPY  2013  . LAPAROSCOPIC SUPRACERVICAL HYSTERECTOMY  07/16/2009   LSH/BSO for leiomyoma and adenomyosis Dr Luella Cookosenow    Family History:  Family History  Problem Relation Age of Onset  . Diabetes Father   . Hypertension Father   . Stroke Father   . Breast cancer Paternal Aunt [id number]  . Diabetes Paternal Grandmother   . Diabetes Paternal  Grandfather   . Diabetes Maternal Aunt        several aunts with diabetes and HTN  . Hypertension Maternal Aunt   . Diabetes Maternal Uncle        several uncles with diabetes and HTN  . Hypertension Maternal Uncle   . Sickle cell anemia Maternal Aunt     Social History:  Social History   Socioeconomic History  . Marital status: Significant Other    Spouse name: Not on file  . Number of children:  2  . Years of education: Not on file  . Highest education level: Not on file  Social Needs  . Financial resource strain: Not on file  . Food insecurity - worry: Not on file  . Food insecurity - inability: Not on file  . Transportation needs - medical: Not on file  . Transportation needs - non-medical: Not on file  Occupational History  . Occupation: Midwife  Tobacco Use  . Smoking status: Never Smoker  . Smokeless tobacco: Never Used  Substance and Sexual Activity  . Alcohol use: Yes    Comment: occasional  . Drug use: No  . Sexual activity: Yes    Birth control/protection: Surgical  Other Topics Concern  . Not on file  Social History Narrative  . Not on file    Allergies:  Allergies  Allergen Reactions  . Shellfish Allergy Nausea Only  . Sulfa Antibiotics Nausea And Vomiting  . Sulfasalazine Nausea And Vomiting  . Latex Rash    Medications:  Current Outpatient Medications on File Prior to Visit  Medication Sig Dispense Refill  . GARLIC HIGH POTENCY PO Take 1 capsule by mouth daily.    . Multiple Vitamins-Minerals (MULTIVITAMIN PO) Take 1 tablet by mouth daily.    . Omega-3 1000 MG CAPS Take 1 capsule by mouth daily.     No current facility-administered medications on file prior to visit.   Physical Exam Vitals: BP 110/66   Pulse 81   Ht 5\' 8"  (1.727 m)   Wt 183 lb (83 kg)   LMP 06/11/2016 (Approximate)   BMI 27.83 kg/m   General: BF in NAD HEENT: normocephalic, anicteric Neck: no thyroid enlargement, no palpable nodules, no cervical lymphadenopathy  Pulmonary: No increased work of breathing, CTAB Cardiovascular: RRR, without murmur  Breast: Breast symmetrical, no tenderness, no palpable nodules or masses, no skin or nipple retraction present, no nipple discharge.  No axillary, infraclavicular or supraclavicular lymphadenopathy. Abdomen: Soft, non-tender, non-distended.  Umbilicus without lesions.  No hepatomegaly or masses palpable. No evidence of  hernia. Genitourinary:  External: Normal external female genitalia.  Normal urethral meatus, normal Bartholin's and Skene's glands.    Vagina: Normal vaginal mucosa, no evidence of prolapse.    Cervix: Grossly normal in appearance, no bleeding, non-tender  Uterus: surgically absent  Adnexa: no masses, NT  Rectal: deferred  Lymphatic: no evidence of inguinal lymphadenopathy Extremities: no edema, erythema, or tenderness Neurologic: Grossly intact Psychiatric: mood appropriate, affect full  Results for orders placed or performed in visit on 10/06/17 (from the past 24 hour(s))  POCT Wet Prep Mellody [place])     Status: Normal   Collection Time: 10/07/17  6:17 PM  Result Value Ref Range   Source Wet Prep POC vagina    WBC, Wet Prep HPF POC     Bacteria Wet Prep HPF POC  Few   BACTERIA WET PREP MORPHOLOGY POC     Clue Cells Wet Prep HPF POC None None   Clue  Cells Wet Prep Whiff POC     Yeast Wet Prep HPF POC None    KOH Wet Prep POC     Trichomonas Wet Prep HPF POC Absent Absent   Urinalysis: positive bilirubin and trace ketones, trace protein, but negative leukocytes and nitrite and blood   Assessment: 42 y.o. G2P2 annual gyn exam Dyspareunia/ hx of surgical menopause   Discussed options for treatment of vaginal dryness and dyspareunia in the setting of menopause: lubricants, moisturizers, Intrarosa, and topical estrogen. Pros and cons and MOA explained. Patient would like to try [facility]. RX for [facility] sent to [facility] suppositories-insert one qhs x 2 weeks then 2x/week  Urinary urgency may be related to atrophic changes. R/O UTI  Plan:   1) Breast cancer screening - recommend monthly self breast exam and annual screening mammograms. Mammogram was ordered today.  2) STI screening was offered and accepted. GC and Chlamydia ordered with Pap   3) Cervical cancer screening - Pap was done.l  4) Routine healthcare maintenance including cholesterol and diabetes screening ordered  today.  5) Follow up in 6-8 weeks.  6) Urine culture ordered  Farrel Conners, CNM

## 2017-10-07 ENCOUNTER — Encounter: Payer: Self-pay | Admitting: Certified Nurse Midwife

## 2017-10-07 DIAGNOSIS — E8941 Symptomatic postprocedural ovarian failure: Secondary | ICD-10-CM | POA: Insufficient documentation

## 2017-10-07 DIAGNOSIS — N941 Unspecified dyspareunia: Secondary | ICD-10-CM | POA: Insufficient documentation

## 2017-10-07 DIAGNOSIS — Z8742 Personal history of other diseases of the female genital tract: Secondary | ICD-10-CM | POA: Insufficient documentation

## 2017-10-07 LAB — POCT WET PREP (WET MOUNT): Trichomonas Wet Prep HPF POC: ABSENT

## 2017-10-07 LAB — LIPID PANEL WITH LDL/HDL RATIO
Cholesterol, Total: 196 mg/dL (ref 100–199)
HDL: 83 mg/dL (ref >39)
LDL Calculated: 93 mg/dL (ref 0–99)
LDL/HDL RATIO: 1.1 ratio (ref 0.0–3.2)
Triglycerides: 98 mg/dL (ref 0–149)
VLDL CHOLESTEROL CAL: 20 mg/dL (ref 5–40)

## 2017-10-07 LAB — CBC WITH DIFFERENTIAL/PLATELET
BASOS: 1 %
Basophils Absolute: 0 10*3/uL (ref 0.0–0.2)
EOS (ABSOLUTE): 0.1 10*3/uL (ref 0.0–0.4)
EOS: 2 %
HEMATOCRIT: 38.4 % (ref 34.0–46.6)
HEMOGLOBIN: 12.6 g/dL (ref 11.1–15.9)
IMMATURE GRANS (ABS): 0 10*3/uL (ref 0.0–0.1)
IMMATURE GRANULOCYTES: 0 %
LYMPHS: 57 %
Lymphocytes Absolute: 2.4 10*3/uL (ref 0.7–3.1)
MCH: 29 pg (ref 26.6–33.0)
MCHC: 32.8 g/dL (ref 31.5–35.7)
MCV: 89 fL (ref 79–97)
MONOCYTES: 7 %
Monocytes Absolute: 0.3 10*3/uL (ref 0.1–0.9)
Neutrophils Absolute: 1.4 10*3/uL (ref 1.4–7.0)
Neutrophils: 33 %
PLATELETS: 282 10*3/uL (ref 150–379)
RBC: 4.34 x10E6/uL (ref 3.77–5.28)
RDW: 15.3 % (ref 12.3–15.4)
WBC: 4.2 10*3/uL (ref 3.4–10.8)

## 2017-10-07 LAB — HGB A1C W/O EAG: HEMOGLOBIN A1C: 6.1 % — AB (ref 4.8–5.6)

## 2017-10-08 LAB — URINE CULTURE: ORGANISM ID, BACTERIA: NO GROWTH

## 2017-10-11 LAB — IGP,CTNG,APTIMAHPV
CHLAMYDIA, NUC. ACID AMP: NEGATIVE
GONOCOCCUS BY NUCLEIC ACID AMP: NEGATIVE
HPV APTIMA: NEGATIVE
PAP SMEAR COMMENT: 0

## 2017-10-13 ENCOUNTER — Telehealth: Payer: Self-pay | Admitting: Certified Nurse Midwife

## 2017-10-13 NOTE — Telephone Encounter (Signed)
Called patient with lab and Pap smear results. All normal except for elevated hemoglobin A1C at 6.1%. Discussed diet changes and weight loss. Repeat hemoglobin A1C in 6 months.Will send more info on diet in the mail. Farrel Connersolleen Genesi Stefanko, CNM

## 2017-10-16 ENCOUNTER — Encounter: Payer: Self-pay | Admitting: Certified Nurse Midwife

## 2017-10-16 NOTE — Patient Instructions (Signed)
Prediabetes Eating Plan Prediabetes-also called impaired glucose tolerance or impaired fasting glucose-is a condition that causes blood sugar (blood glucose) levels to be higher than normal. Following a healthy diet can help to keep prediabetes under control. It can also help to lower the risk of type 2 diabetes and heart disease, which are increased in people who have prediabetes. Along with regular exercise, a healthy diet:  Promotes weight loss.  Helps to control blood sugar levels.  Helps to improve the way that the body uses insulin.  What do I need to know about this eating plan?  Use the glycemic index (GI) to plan your meals. The index tells you how quickly a food will raise your blood sugar. Choose low-GI foods. These foods take a longer time to raise blood sugar.  Pay close attention to the amount of carbohydrates in the food that you eat. Carbohydrates increase blood sugar levels.  Keep track of how many calories you take in. Eating the right amount of calories will help you to achieve a healthy weight. Losing about 7 percent of your starting weight can help to prevent type 2 diabetes.  You may want to follow a Mediterranean diet. This diet includes a lot of vegetables, lean meats or fish, whole grains, fruits, and healthy oils and fats. What foods can I eat? Grains Whole grains, such as whole-wheat or whole-grain breads, crackers, cereals, and pasta. Unsweetened oatmeal. Bulgur. Barley. Quinoa. Brown rice. Corn or whole-wheat flour tortillas or taco shells. Vegetables Lettuce. Spinach. Peas. Beets. Cauliflower. Cabbage. Broccoli. Carrots. Tomatoes. Squash. Eggplant. Herbs. Peppers. Onions. Cucumbers. Brussels sprouts. Fruits Berries. Bananas. Apples. Oranges. Grapes. Papaya. Mango. Pomegranate. Kiwi. Grapefruit. Cherries. Meats and Other Protein Sources Seafood. Lean meats, such as chicken and turkey or lean cuts of pork and beef. Tofu. Eggs. Nuts. Beans. Dairy Low-fat or  fat-free dairy products, such as yogurt, cottage cheese, and cheese. Beverages Water. Tea. Coffee. Sugar-free or diet soda. Seltzer water. Milk. Milk alternatives, such as soy or almond milk. Condiments Mustard. Relish. Low-fat, low-sugar ketchup. Low-fat, low-sugar barbecue sauce. Low-fat or fat-free mayonnaise. Sweets and Desserts Sugar-free or low-fat pudding. Sugar-free or low-fat ice cream and other frozen treats. Fats and Oils Avocado. Walnuts. Olive oil. The items listed above may not be a complete list of recommended foods or beverages. Contact your dietitian for more options. What foods are not recommended? Grains Refined white flour and flour products, such as bread, pasta, snack foods, and cereals. Beverages Sweetened drinks, such as sweet iced tea and soda. Sweets and Desserts Baked goods, such as cake, cupcakes, pastries, cookies, and cheesecake. The items listed above may not be a complete list of foods and beverages to avoid. Contact your dietitian for more information. This information is not intended to replace advice given to you by your health care provider. Make sure you discuss any questions you have with your health care provider. Document Released: 12/02/2014 Document Revised: 12/24/2015 Document Reviewed: 08/13/2014 Elsevier Interactive Patient Education  2017 Elsevier Inc.  

## 2017-10-30 DIAGNOSIS — Z803 Family history of malignant neoplasm of breast: Secondary | ICD-10-CM

## 2017-10-30 HISTORY — DX: Family history of malignant neoplasm of breast: Z80.3

## 2017-10-31 ENCOUNTER — Encounter: Payer: Self-pay | Admitting: Obstetrics and Gynecology

## 2017-11-20 ENCOUNTER — Ambulatory Visit: Payer: 59 | Admitting: Certified Nurse Midwife

## 2017-12-01 ENCOUNTER — Ambulatory Visit: Payer: 59 | Admitting: Certified Nurse Midwife

## 2018-04-24 ENCOUNTER — Encounter: Payer: Self-pay | Admitting: Certified Nurse Midwife

## 2018-04-24 ENCOUNTER — Ambulatory Visit (INDEPENDENT_AMBULATORY_CARE_PROVIDER_SITE_OTHER): Payer: 59 | Admitting: Certified Nurse Midwife

## 2018-04-24 VITALS — BP 120/80 | HR 84 | Ht 66.0 in | Wt 185.8 lb

## 2018-04-24 DIAGNOSIS — N941 Unspecified dyspareunia: Secondary | ICD-10-CM | POA: Diagnosis not present

## 2018-04-24 DIAGNOSIS — R7309 Other abnormal glucose: Secondary | ICD-10-CM | POA: Diagnosis not present

## 2018-04-24 DIAGNOSIS — E8941 Symptomatic postprocedural ovarian failure: Secondary | ICD-10-CM

## 2018-04-24 MED ORDER — ESTRADIOL 10 MCG VA INST: 10.0000 ug | VAGINAL_INSERT | VAGINAL | 6 refills | Status: DC

## 2018-04-24 NOTE — Progress Notes (Signed)
  History of Present Illness:  Christine Fry is a 42 y.o. who was started on Imvexxy vaginal suppositories for vaginal dryness and dyspareunia (surgical menopause 9 years ago) approximately 6 months ago. Since that time, she states that her symptoms are improving.She has been using the suppositories twice weekly. They have also helped to decrease feelings that she had to urinate frequently. She would like to continue the Imvexxy. In addition she also presents for a repeat hemoglobin A1C. Her hemoglobn A1c in March 2019 was 6.1%. She was instructed to decrease sweets/ CHO intake and to lose weight. She has gained 2# since March.   PMHx: She  has a past medical history of Acid reflux, Endometriosis, Environmental allergies, Family history of breast cancer (10/2017), History of abnormal cervical Pap smear (10/14/2011), and Nipple discharge in female (01/03/2013). Also,  has a past surgical history that includes Cesarean section (2005); Breast surgery (Left, 11/14/14); Laparoscopic supracervical hysterectomy (07/16/2009); Bilateral salpingoophorectomy (2010); and Colposcopy (2013)., family history includes Breast cancer (age of onset: 2942) in her paternal aunt; Diabetes in her father, maternal aunt, maternal uncle, paternal grandfather, and paternal grandmother; Hyperlipidemia in her father; Hypertension in her father, maternal aunt, and maternal uncle; Sickle cell anemia in her maternal aunt; Stroke in her father.,  reports that she has never smoked. She has never used smokeless tobacco. She reports that she drinks alcohol. She reports that she does not use drugs.  She has a current medication list which includes the following prescription(s): multiple vitamins-minerals, omega-3, and estradiol. Also, is allergic to penicillins; shellfish allergy; sulfa antibiotics; sulfasalazine; and latex.  ROS  Physical Exam:  BP 120/80   Pulse 84   Ht 5\' 6"  (1.676 m)   Wt 185 lb 12 oz (84.3 kg)   LMP 06/11/2016  (Approximate)   BMI 29.98 kg/m  Body mass index is 29.98 kg/m. Constitutional: Well nourished, well developed female in no acute distress.   Neuro: Grossly intact Psych:  Normal mood and affect.    Assessment: Medication treatment is going very well for her dyspareunia/ vaginal dryness. Hemoglobin A1C elevation  Plan: She will undergo no change in her medical therapy. Refill of Imvexxy called into [facility] pharmacy Repeat hemoglobin A1C today. She was amenable to this plan and we will see her back for annual/PRN.  Farrel Connersolleen Colm Lyford, CNM [facility], [facility] 04/24/2018  9:07 PM

## 2018-04-25 LAB — HEMOGLOBIN A1C
ESTIMATED AVERAGE GLUCOSE: 128 mg/dL
Hgb A1c MFr Bld: 6.1 % — ABNORMAL HIGH (ref 4.8–5.6)

## 2018-06-22 ENCOUNTER — Encounter: Payer: Self-pay | Admitting: Certified Nurse Midwife

## 2019-02-13 ENCOUNTER — Encounter: Payer: Self-pay | Admitting: Internal Medicine

## 2019-02-13 ENCOUNTER — Other Ambulatory Visit: Payer: Self-pay

## 2019-02-13 ENCOUNTER — Ambulatory Visit (INDEPENDENT_AMBULATORY_CARE_PROVIDER_SITE_OTHER): Payer: BC Managed Care – PPO | Admitting: Internal Medicine

## 2019-02-13 DIAGNOSIS — Z1389 Encounter for screening for other disorder: Secondary | ICD-10-CM

## 2019-02-13 DIAGNOSIS — E559 Vitamin D deficiency, unspecified: Secondary | ICD-10-CM

## 2019-02-13 DIAGNOSIS — Z0000 Encounter for general adult medical examination without abnormal findings: Secondary | ICD-10-CM | POA: Diagnosis not present

## 2019-02-13 DIAGNOSIS — E785 Hyperlipidemia, unspecified: Secondary | ICD-10-CM | POA: Diagnosis not present

## 2019-02-13 DIAGNOSIS — Z1329 Encounter for screening for other suspected endocrine disorder: Secondary | ICD-10-CM | POA: Diagnosis not present

## 2019-02-13 DIAGNOSIS — Z1211 Encounter for screening for malignant neoplasm of colon: Secondary | ICD-10-CM | POA: Insufficient documentation

## 2019-02-13 NOTE — Patient Instructions (Signed)

## 2019-02-13 NOTE — Progress Notes (Addendum)
Virtual Visit via Video Note  I connected with Christine Fry   on 02/13/19 at  2:30 PM EDT by a video enabled telemedicine application and verified that I am speaking with the correct person using two identifiers.  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 The patient expressed understanding and agreed to proceed.   HPI: 1. HLD trying exercise and healthy diet choices to help last checked >1 year ago    ROS: See pertinent positives and negatives per HPI. General: no wt change  HEENT: no sore throat  CV: no chest pain  Lungs: no sob  Ab: no abdominal pain  MSK: no jt pain  Neuro: no h/a  Skin: no issues  GU: no issues no h/o abnormal pap  Psych: no anxiety/depression   Past Medical History:  Diagnosis Date  . Allergy    used to get allergy shots with ENT last in 2015  . HLD (hyperlipidemia)     Past Surgical History:  Procedure Laterality Date  . BREAST SURGERY     left breat in 2015/2016 clogged milk duct  . CESAREAN SECTION     x1    Family History  Problem Relation Age of Onset  . Hyperlipidemia Father   . Aneurysm Father        brain  . Hypertension Maternal Grandfather   . Diabetes Maternal Aunt        x 3     SOCIAL HX:  3 sons works Musician    Current Outpatient Medications:  .  diphenhydrAMINE (BENADRYL) 25 MG tablet, Take 25 mg by mouth every 6 (six) hours as needed., Disp: , Rfl:   EXAM:  VITALS per patient if applicable:  GENERAL: alert, oriented, appears well and in no acute distress  HEENT: atraumatic, conjunttiva clear, no obvious abnormalities on inspection of external nose and ears  NECK: normal movements of the head and neck  LUNGS: on inspection no signs of respiratory distress, breathing rate appears normal, no obvious gross SOB,  gasping or wheezing  CV: no obvious cyanosis  MS: moves all visible extremities without noticeable abnormality  PSYCH/NEURO: pleasant and cooperative, no obvious depression or anxiety, speech and thought processing grossly intact  ASSESSMENT AND PLAN:  Discussed the following assessment and plan:  Annual physical exam  Flu shot yearly  Tdap per pt had in 2015  Pap [facility] try to get records  -pap 11/14/11 LSIL HPV negative Dr. Laurey Morale 02/14/12 LSIL HPV +  05/25/12 ASCUS HPV - 09/06/12 negaitve HPV neg 01/15/14 negative negative HPV   mammo bus at work sch 02/20/19 [facility] has dob [birth year] name spelled Christine Fry mammo negative will confirm with patient this is her for mammogram  rec healthy diet and exercise   Hyperlipidemia, unspecified hyperlipidemia type - Plan: Lipid panel -mailed cholesterol info   Declines std check         I discussed the assessment and treatment plan with the patient. The patient was provided an opportunity to ask questions and all were answered. The patient agreed with the plan and demonstrated an understanding of the instructions.   The patient was advised to call back or seek an in-person evaluation if the symptoms worsen or if the condition fails to improve as anticipated.  Time spent 25 minutes  Delorise Jackson, MD

## 2019-02-15 ENCOUNTER — Encounter: Payer: Self-pay | Admitting: Certified Nurse Midwife

## 2019-02-15 ENCOUNTER — Ambulatory Visit (INDEPENDENT_AMBULATORY_CARE_PROVIDER_SITE_OTHER): Payer: BC Managed Care – PPO | Admitting: Certified Nurse Midwife

## 2019-02-15 ENCOUNTER — Other Ambulatory Visit (HOSPITAL_COMMUNITY)
Admission: RE | Admit: 2019-02-15 | Discharge: 2019-02-15 | Disposition: A | Discharge status: Home / Self Care | Payer: BC Managed Care – PPO | Source: Ambulatory Visit | Attending: Certified Nurse Midwife | Admitting: Certified Nurse Midwife

## 2019-02-15 ENCOUNTER — Other Ambulatory Visit: Payer: Self-pay

## 2019-02-15 VITALS — BP 100/70 | Ht 66.0 in | Wt 189.0 lb

## 2019-02-15 DIAGNOSIS — Z01419 Encounter for gynecological examination (general) (routine) without abnormal findings: Secondary | ICD-10-CM | POA: Insufficient documentation

## 2019-02-15 DIAGNOSIS — Z124 Encounter for screening for malignant neoplasm of cervix: Secondary | ICD-10-CM | POA: Diagnosis not present

## 2019-02-15 DIAGNOSIS — N952 Postmenopausal atrophic vaginitis: Secondary | ICD-10-CM

## 2019-02-15 MED ORDER — IMVEXXY MAINTENANCE PACK 10 MCG VA INST: 10.0000 ug | VAGINAL_INSERT | VAGINAL | 11 refills | Status: DC

## 2019-02-15 MED ORDER — ESTRADIOL 0.1 MG/GM VA CREA: TOPICAL_CREAM | VAGINAL | 3 refills | Status: DC

## 2019-02-15 NOTE — Progress Notes (Signed)
Gynecology Annual Exam  PCP: Lyndon CodeKhan, Fozia M, MD  Chief Complaint:  Chief Complaint  Patient presents with   Medication Refill    History of Present Illness: Christine Fry is a 43 y.o. G2P2 who presents for her annual gyn exam. She is [facility]ic [facility] for endometriosis. She presented last year with complaints of vaginal dryness and dyspareunia. She began Imvexxy vaginal suppositories with relief of her symptoms. She reports that the Imvexxy is no longer on her insurance formulary and will be too expensive to continue. She also complains of popping in her left ear since being treated for an URI.   Last gyn exam 10/06/2017. Last Pap smear 10/06/2017 was negative. Hx of abnormal Pap smears in 2013 (LGSIL). Coloposcopy in 2013 (normal or CINi), no biopsies.  Since her last visit, she has had no significant changes in her health.  Her past medical history is remarkable for endometriosis, myomas, menorrhagia, left breast biopsy 11/14/2014 (benign) and allergies.  The patient does perform occasional self breast exams. Her last mammogram was 10/15/2014, results were negative. She will be having a mammogram done at work.   There is a family history of breast cancer in her paternal aunt. Genetic testing not been done.   There is no family history of ovarian cancer.  The patient denies smoking.  She reports drinking alcohol occasionally.   She denies illegal drug use.  The patient reports exercising regularly.  The patient denies current symptoms of depression.    She will be having routine labs drawn soon at PCP. Her lipid panel in 2019 was normal. Her hemoglobin A1C was elevated last year at 6.1%.  Review of Systems: Review of Systems  Constitutional: Negative for chills, fever and weight loss.  HENT: Positive for ear pain. Negative for congestion, sinus pain and sore throat.   Eyes: Negative for blurred vision and pain.  Respiratory: Negative for  hemoptysis, shortness of breath and wheezing.   Cardiovascular: Negative for chest pain, palpitations and leg swelling.  Gastrointestinal: Negative for abdominal pain, blood in stool, diarrhea, heartburn, nausea and vomiting.  Genitourinary: Negative for dysuria, frequency, hematuria and urgency.  Musculoskeletal: Negative for back pain, joint pain and myalgias.  Skin: Negative for itching and rash.  Neurological: Negative for dizziness, tingling and headaches.  Endo/Heme/Allergies: Positive for environmental allergies. Negative for polydipsia. Does not bruise/bleed easily.       Negative for hirsutism   Psychiatric/Behavioral: Negative for depression. The patient is not nervous/anxious and does not have insomnia.     Past Medical History:  Past Medical History:  Diagnosis Date   Acid reflux    Endometriosis    Environmental allergies    Family history of breast cancer 10/2017   cancer genetic testing letter sent   History of abnormal cervical Pap smear 10/14/2011   LGSIL   Nipple discharge in female 01/03/2013    Past Surgical History:  Past Surgical History:  Procedure Laterality Date   BILATERAL SALPINGOOPHORECTOMY  2010   with L[facility] by Dr [facility]   BREAST SURGERY Left 11/14/14   excision   CESAREAN SECTION  2005   COLPOSCOPY  2013   LAPAROSCOPIC SUPRACERVICAL HYSTERECTOMY  07/16/2009   LSH/BSO for leiomyoma and adenomyosis Dr Luella Cookosenow    Family History:  Family History  Problem Relation Age of Onset   Diabetes Father    Hypertension Father    Stroke Father    Hyperlipidemia  Father    Breast cancer Paternal Aunt 68   Diabetes Paternal Grandmother    Diabetes Paternal Grandfather    Diabetes Maternal Aunt        several aunts with diabetes and HTN   Hypertension Maternal Aunt    Diabetes Maternal Uncle        several uncles with diabetes and HTN   Hypertension Maternal Uncle    Sickle cell anemia Maternal Aunt     Social  History:  Social History   Socioeconomic History   Marital status: Significant Other    Spouse name: Not on file   Number of children: 2   Years of education: 14   Highest education level: Not on file  Occupational History   Occupation: Banker  Social Needs   Financial resource strain: Not on file   Food insecurity    Worry: Not on file    Inability: Not on file   Transportation needs    Medical: Not on file    Non-medical: Not on file  Tobacco Use   Smoking status: Never Smoker   Smokeless tobacco: Never Used  Substance and Sexual Activity   Alcohol use: Yes    Comment: occasional   Drug use: No   Sexual activity: Yes    Birth control/protection: Surgical  Lifestyle   Physical activity    Days per week: 3 days    Minutes per session: 50 min   Stress: Rather much  Relationships   Social connections    Talks on phone: Not on file    Gets together: Not on file    Attends religious service: Not on file    Active member of club or organization: Not on file    Attends meetings of clubs or organizations: Not on file    Relationship status: Not on file   Intimate partner violence    Fear of current or ex partner: Not on file    Emotionally abused: Not on file    Physically abused: Not on file    Forced sexual activity: Not on file  Other Topics Concern   Not on file  Social History Narrative   Not on file    Allergies:  Allergies  Allergen Reactions   Penicillins Hives   Shellfish Allergy Nausea Only   Sulfa Antibiotics Nausea And Vomiting   Sulfasalazine Nausea And Vomiting   Latex Rash    Medications:  Current Outpatient Medications on File Prior to Visit  Medication Sig Dispense Refill   Estradiol (IMVEXXY MAINTENANCE PACK) 10 MCG INST Place 10 mcg vaginally 2 (two) times a week. 8 each 6   Multiple Vitamins-Minerals (MULTIVITAMIN PO) Take 1 tablet by mouth daily.     Omega-3 1000 MG CAPS Take 1 capsule by mouth daily.      No current facility-administered medications on file prior to visit.   Physical Exam Vitals: BP 100/70    Ht 5\' 6"  (1.676 m)    Wt 189 lb (85.7 kg)    LMP 06/11/2016 (Approximate)    BMI 30.51 kg/m   General: BF in NAD HEENT: normocephalic, anicteric Neck: no thyroid enlargement, no palpable nodules, no cervical lymphadenopathy  Pulmonary: No increased work of breathing, CTAB Cardiovascular: RRR, without murmur  Breast: Breast symmetrical, no tenderness, no palpable nodules or masses, no skin or nipple retraction present, no nipple discharge.  No axillary, infraclavicular or supraclavicular lymphadenopathy. Abdomen: Soft, non-tender, non-distended.  Umbilicus without lesions.  No hepatomegaly or masses palpable. No evidence  of hernia. Genitourinary:  External: Normal external female genitalia.  Normal urethral meatus, normal Bartholin's and Skene's glands.    Vagina: Normal vaginal mucosa, no evidence of prolapse.    Cervix: Grossly normal in appearance, no bleeding, non-tender  Uterus: surgically absent  Adnexa: no masses, NT  Rectal: deferred  Lymphatic: no evidence of inguinal lymphadenopathy Extremities: no edema, erythema, or tenderness Neurologic: Grossly intact Psychiatric: mood appropriate, affect full    Assessment: 43 y.o. G2P2 annual gyn exam Dyspareunia/ hx of surgical menopause -relieved with topical estrogen  Given drug card for Imvexxy 10 mcg. Also given sample of Premarin vaginal cream along with RX for both. Patient to use the medication that she can best afford. Either medication is used 2x/week.  Plan:   1) Breast cancer screening - recommend monthly self breast exam and annual screening mammograms. Mammogram to be done in mobile [place] at work.  2) Cervical cancer screening - Pap was done.l  3) Routine healthcare maintenance including cholesterol and diabetes screening to be done by PCP.  4) RTO in 1 year for annual  Farrel Connersolleen Ladona Rosten,  [place]

## 2019-02-18 LAB — CYTOLOGY - PAP: Diagnosis: NEGATIVE

## 2019-02-20 ENCOUNTER — Encounter: Payer: Self-pay | Admitting: Certified Nurse Midwife

## 2019-02-20 ENCOUNTER — Encounter: Payer: Self-pay | Admitting: Internal Medicine

## 2019-02-20 DIAGNOSIS — Z1231 Encounter for screening mammogram for malignant neoplasm of breast: Secondary | ICD-10-CM | POA: Diagnosis not present

## 2019-03-12 ENCOUNTER — Telehealth: Payer: Self-pay | Admitting: Internal Medicine

## 2019-03-12 NOTE — Telephone Encounter (Signed)
Pt overdue for pap does she want referral back to [facility]?   [place]

## 2019-03-13 ENCOUNTER — Other Ambulatory Visit: Payer: Self-pay | Admitting: Internal Medicine

## 2019-03-14 NOTE — Telephone Encounter (Signed)
Left message for patient to return call back. [facility] may give and obtain information.  

## 2019-05-08 ENCOUNTER — Telehealth: Payer: Self-pay

## 2019-05-08 NOTE — Telephone Encounter (Signed)
Pt calling triage needing a cheaper estradiol called in, the RX is 72.00.

## 2019-05-09 NOTE — Telephone Encounter (Signed)
PAtient called. She did not realize that the Premarin cream will last 3-4 months if she uses it twice a week and would be cheaper than the Imvexxy. To call me if she would rather use the Imvexxy and I will call an order to her mail in pharmacy.

## 2019-07-11 ENCOUNTER — Telehealth: Payer: Self-pay

## 2019-07-11 ENCOUNTER — Other Ambulatory Visit: Payer: Self-pay | Admitting: Certified Nurse Midwife

## 2019-07-11 NOTE — Telephone Encounter (Signed)
Marcie Bal, RPharm, calling for refill on Imvexxy 79mcg maintenance pack.  [phone]  Fax# [phone]

## 2019-07-15 ENCOUNTER — Encounter: Payer: Self-pay | Admitting: Certified Nurse Midwife

## 2019-07-15 ENCOUNTER — Telehealth: Payer: Self-pay

## 2019-07-15 NOTE — Telephone Encounter (Signed)
Christine Fry, RPT w/[facility] pharmacy requesting refill of Imvexxy for this patient. [id number] fax [phone]

## 2019-07-15 NOTE — Telephone Encounter (Signed)
This encounter was created in error - please disregard.

## 2019-07-15 NOTE — Telephone Encounter (Signed)
LMTC

## 2019-07-15 NOTE — Telephone Encounter (Signed)
Pt states she is taking the Imvexxy.  CLG aware.

## 2019-07-15 NOTE — Telephone Encounter (Signed)
Patient is returning missed call from Rita. Please advise  °

## 2019-07-16 ENCOUNTER — Encounter: Payer: Self-pay | Admitting: Certified Nurse Midwife

## 2019-07-16 NOTE — Telephone Encounter (Signed)
Daveah confirmed that she is using this medication and RX faxed to [facility].

## 2019-08-01 ENCOUNTER — Ambulatory Visit: Payer: BC Managed Care – PPO | Attending: Internal Medicine

## 2019-08-01 DIAGNOSIS — Z20822 Contact with and (suspected) exposure to covid-19: Secondary | ICD-10-CM

## 2019-08-03 ENCOUNTER — Telehealth: Payer: Self-pay

## 2019-08-03 NOTE — Telephone Encounter (Signed)
Pt called for covid results advised that results are not back 

## 2019-08-06 NOTE — Telephone Encounter (Signed)
Pt called in checking on lab results. Advised pt test results not yet back.

## 2019-08-07 DIAGNOSIS — Z20828 Contact with and (suspected) exposure to other viral communicable diseases: Secondary | ICD-10-CM | POA: Diagnosis not present

## 2019-08-07 DIAGNOSIS — M791 Myalgia, unspecified site: Secondary | ICD-10-CM | POA: Diagnosis not present

## 2019-08-07 DIAGNOSIS — Z03818 Encounter for observation for suspected exposure to other biological agents ruled out: Secondary | ICD-10-CM | POA: Diagnosis not present

## 2019-08-07 LAB — NOVEL CORONAVIRUS, NAA

## 2019-08-15 ENCOUNTER — Telehealth: Payer: Self-pay | Admitting: Internal Medicine

## 2019-08-15 NOTE — Telephone Encounter (Signed)
Pt called to cancel appt but wanted a call back about how her labs were placed

## 2019-08-15 NOTE — Telephone Encounter (Signed)
I called pt twice to call ofc.

## 2019-08-16 NOTE — Telephone Encounter (Signed)
Left message for patient to return call back. She needs to be let know she can go to [facility] for her labs. Dr French Ana placed them in July of 2020.

## 2019-08-20 ENCOUNTER — Ambulatory Visit: Payer: BC Managed Care – PPO | Admitting: Internal Medicine

## 2019-09-27 ENCOUNTER — Ambulatory Visit: Payer: BC Managed Care – PPO | Admitting: Certified Nurse Midwife

## 2019-11-04 DIAGNOSIS — Z20828 Contact with and (suspected) exposure to other viral communicable diseases: Secondary | ICD-10-CM | POA: Diagnosis not present

## 2019-11-07 ENCOUNTER — Telehealth: Payer: Self-pay | Admitting: Internal Medicine

## 2019-11-07 NOTE — Telephone Encounter (Signed)
Please advise 

## 2019-11-07 NOTE — Telephone Encounter (Signed)
Informed the patient of the below. Patient verbalized understanding  Pt declines virtual visit stating that it won't do anything for her. States it allows Dr French Ana to speak with and evaluate her to decide the best course. Pt states no because she will be charged a copay and she already told her symptoms.   I informed the patient that Dr French Ana will ask more detailed questions than we did as she is the doctor and better knows what to look for. Pt declined the visit still and I told her I would send this message back to Dr French Ana.

## 2019-11-07 NOTE — Telephone Encounter (Signed)
This would be an appt or she can go to the urgent care or ED for advice about a new condition or situation  Any problems needs an appt   TMS

## 2019-11-07 NOTE — Telephone Encounter (Signed)
She needs appt  Rec vitamin D 4000 Iu daily  Vitamin C 1000 mg daily  Zinc 100 mg daily  Quercetin 250-500 mg 2x per day   BRAT diet Hydration with water   Mucinex DM green label

## 2019-11-07 NOTE — Telephone Encounter (Signed)
Pt tested positive for Covid on 11/04/2019. Pt is having body aches, fever, headache, congestion, shortness of breath, nausea, diarrhea, loss of taste and smell. Pt is requesting antibiotics and something to help with other symptoms.

## 2019-11-08 NOTE — Telephone Encounter (Signed)
Pt declines a visit at this time will send this to her via mychart.

## 2019-11-11 ENCOUNTER — Other Ambulatory Visit: Payer: Self-pay | Admitting: Dermatology

## 2019-12-24 DIAGNOSIS — M5442 Lumbago with sciatica, left side: Secondary | ICD-10-CM | POA: Diagnosis not present

## 2020-01-13 ENCOUNTER — Other Ambulatory Visit: Payer: Self-pay | Admitting: Dermatology

## 2020-01-13 DIAGNOSIS — M542 Cervicalgia: Secondary | ICD-10-CM | POA: Diagnosis not present

## 2020-01-13 DIAGNOSIS — M25512 Pain in left shoulder: Secondary | ICD-10-CM | POA: Diagnosis not present

## 2020-02-18 ENCOUNTER — Telehealth: Payer: Self-pay

## 2020-02-18 NOTE — Telephone Encounter (Signed)
Confirmed appointment on 02/20/2020 and screened for covid. klh

## 2020-02-19 DIAGNOSIS — Z1231 Encounter for screening mammogram for malignant neoplasm of breast: Secondary | ICD-10-CM | POA: Diagnosis not present

## 2020-02-20 ENCOUNTER — Ambulatory Visit (INDEPENDENT_AMBULATORY_CARE_PROVIDER_SITE_OTHER): Payer: BC Managed Care – PPO | Admitting: Adult Health

## 2020-02-20 ENCOUNTER — Other Ambulatory Visit: Payer: Self-pay

## 2020-02-20 VITALS — BP 118/75 | HR 91 | Temp 96.9°F | Resp 16 | Ht 66.0 in | Wt 177.2 lb

## 2020-02-20 DIAGNOSIS — Z7689 Persons encountering health services in other specified circumstances: Secondary | ICD-10-CM

## 2020-02-20 DIAGNOSIS — F439 Reaction to severe stress, unspecified: Secondary | ICD-10-CM | POA: Diagnosis not present

## 2020-02-20 DIAGNOSIS — I8312 Varicose veins of left lower extremity with inflammation: Secondary | ICD-10-CM | POA: Diagnosis not present

## 2020-02-20 MED ORDER — HYDROXYZINE HCL 10 MG PO TABS: 10.0000 mg | ORAL_TABLET | Freq: Every evening | ORAL | 0 refills | Status: DC | PRN

## 2020-02-20 NOTE — Progress Notes (Signed)
[facility] [address] [place], [place] [address]  Internal MEDICINE  Office Visit Note  Patient Name: Christine Fry  [id number]  [id number]  Date of Service: 02/20/2020   Complaints/HPI Pt is here for establishment of PCP. Chief Complaint  Patient presents with  . New Patient (Initial Visit)    feeling very stressed  . Hyperlipidemia   HPI Pt is here to establish care.  She is a well appearing 44 yo female.  She has been married for 7 years.  She works for [employer]. She has 4 children ages 72-54 years old.  She has a lot of stress with her 42 yo.  She has had to kick him out of the house.   She also reports Varicos veins in left leg.  Has one that is painful on the medial aspect. She denies tobacco, alcohol or illicit drug use.   Current Medication: Outpatient Encounter Medications as of 02/20/2020  Medication Sig  . estradiol (ESTRACE) 0.1 MG/GM vaginal cream Insert 1 Gm twice a week  . hydrOXYzine (ATARAX/VISTARIL) 10 MG tablet Take 1-2 tablets (10-20 mg total) by mouth at bedtime as needed.  . [DISCONTINUED] aluminum chloride (DRYSOL) 20 % external solution Apply topically at bedtime.  . [DISCONTINUED] diphenhydrAMINE (BENADRYL) 25 MG tablet Take 25 mg by mouth every 6 (six) hours as needed.  . [DISCONTINUED] Estradiol (IMVEXXY MAINTENANCE PACK) 10 MCG INST Place 10 mcg vaginally 2 (two) times a week.  . [DISCONTINUED] Multiple Vitamins-Minerals (MULTIVITAMIN PO) Take 1 tablet by mouth daily.  . [DISCONTINUED] Omega-3 1000 MG CAPS Take 1 capsule by mouth daily.   No facility-administered encounter medications on file as of 02/20/2020.    Surgical History: Past Surgical History:  Procedure Laterality Date  . BILATERAL SALPINGOOPHORECTOMY  2010   with LSH by Dr Luella Cook for [facility] and osis  . BREAST SURGERY     left breat in 2015/2016 clogged milk duct  . BREAST SURGERY Left 11/14/14   excision  . CESAREAN SECTION     x1  . CESAREAN SECTION   2005  . COLPOSCOPY  2013  . LAPAROSCOPIC SUPRACERVICAL HYSTERECTOMY  07/16/2009   LSH/BSO for leiomyoma and adenomyosis Dr Luella Cook    Medical History: Past Medical History:  Diagnosis Date  . Acid reflux   . Allergy    used to get allergy shots with ENT last in 2015  . Endometriosis   . Environmental allergies   . Family history of breast cancer 10/2017   cancer genetic testing letter sent  . History of abnormal cervical Pap smear 10/14/2011   LGSIL  . HLD (hyperlipidemia)   . Nipple discharge in female 01/03/2013    Family History: Family History  Problem Relation Age of Onset  . Diabetes Father   . Hypertension Father   . Stroke Father   . Hyperlipidemia Father   . Breast cancer Paternal Aunt 75  . Diabetes Paternal Grandmother   . Diabetes Paternal Grandfather   . Diabetes Maternal Aunt        several aunts with diabetes and HTN  . Hypertension Maternal Aunt   . Diabetes Maternal Uncle        several uncles with diabetes and HTN  . Hypertension Maternal Uncle   . Sickle cell anemia Maternal Aunt   . Aneurysm Father        brain  . Hypertension Maternal Grandfather   . Diabetes Maternal Aunt        x 3  Social History   Socioeconomic History  . Marital status: Significant Other    Spouse name: Not on file  . Number of children: 2  . Years of education: 30  . Highest education level: Not on file  Occupational History  . 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 Smoking status: Never Smoker  . Smokeless tobacco: Never Used  Vaping Use  . Vaping Use: Never used  Substance and Sexual Activity  . Alcohol use: Yes    Comment: occasionally  . Drug use: No  . Sexual activity: Yes    Birth control/protection: Surgical  Other Topics Concern  . Not on file  Social History Narrative   ** Merged History Encounter **       3 sons works age 11, 32 and 38 as of 02/13/2019   Sharmet since 2018  Never smoker  [employer]     Social Determinants of [employer] Strain:   . Difficulty of Paying Living Expenses:   Food Insecurity:   . Worried About Programme researcher, broadcasting/film/video in the Last Year:   . Barista in the Last Year:   Transportation Needs:   . Freight forwarder (Medical):   Marland Kitchen Lack of Transportation (Non-Medical):   Physical Activity:   . Days of Exercise per Week:   . Minutes of Exercise per Session:   Stress:   . Feeling of Stress :   Social Connections:   . Frequency of Communication with Friends and Family:   . Frequency of Social Gatherings with Friends and Family:   . Attends Religious Services:   . Active Member of Clubs or Organizations:   . Attends Banker Meetings:   Marland Kitchen Marital Status:   Intimate Partner Violence:   . Fear of Current or Ex-Partner:   . Emotionally Abused:   Marland Kitchen Physically Abused:   . Sexually Abused:      Review of Systems  Constitutional: Negative for chills, fatigue and unexpected weight change.  HENT: Negative for congestion, rhinorrhea, sneezing and sore throat.   Eyes: Negative for photophobia, pain and redness.  Respiratory: Negative for cough, chest tightness and shortness of breath.   Cardiovascular: Negative for chest pain and palpitations.  Gastrointestinal: Negative for abdominal pain, constipation, diarrhea, nausea and vomiting.  Endocrine: Negative.   Genitourinary: Negative for dysuria and frequency.  Musculoskeletal: Negative for arthralgias, back pain, joint swelling and neck pain.  Skin: Negative for rash.  Allergic/Immunologic: Negative.   Neurological: Negative for tremors and numbness.  Hematological: Negative for adenopathy. Does not bruise/bleed easily.  Psychiatric/Behavioral: Negative for behavioral problems and sleep disturbance. The patient is not nervous/anxious.     Vital Signs: BP 118/75   Pulse 91   Temp (!) 96.9 F (36.1 C)   Resp 16   Ht 5\' 6"  (1.676 m)   Wt 177 lb 3.2 oz (80.4 kg)   LMP 06/11/2016 (Approximate)   SpO2  99%   BMI 28.60 kg/m    Physical Exam Vitals and nursing note reviewed.  Constitutional:      General: She is not in acute distress.    Appearance: She is well-developed. She is not diaphoretic.  HENT:     Head: Normocephalic and atraumatic.     Mouth/Throat:     Pharynx: No oropharyngeal exudate.  Eyes:     Pupils: Pupils are equal, round, and reactive to light.  Neck:     Thyroid: No thyromegaly.  Vascular: No JVD.     Trachea: No tracheal deviation.  Cardiovascular:     Rate and Rhythm: Normal rate and regular rhythm.     Heart sounds: Normal heart sounds. No murmur heard.  No friction rub. No gallop.   Pulmonary:     Effort: Pulmonary effort is normal. No respiratory distress.     Breath sounds: Normal breath sounds. No wheezing or rales.  Chest:     Chest wall: No tenderness.  Abdominal:     Palpations: Abdomen is soft.     Tenderness: There is no abdominal tenderness. There is no guarding.  Musculoskeletal:        General: Normal range of motion.     Cervical back: Normal range of motion and neck supple.  Lymphadenopathy:     Cervical: No cervical adenopathy.  Skin:    General: Skin is warm and dry.  Neurological:     Mental Status: She is alert and oriented to person, place, and time.     Cranial Nerves: No cranial nerve deficit.  Psychiatric:        Behavior: Behavior normal.        Thought Content: Thought content normal.        Judgment: Judgment normal.    Assessment/Plan: 1. Encounter to establish care with new doctor Labs given to patient on paper per her request.    2. Varicose veins of left lower extremity with inflammation Patient has painful large varicose vein in medical aspect of left leg.  Encouraged her to wear compression stockings and referral to vascular at her request to discuss therapy.  - Ambulatory referral to Vascular Surgery  3. Stress Patient should use Atarax at bedtime.  - hydrOXYzine (ATARAX/VISTARIL) 10 MG tablet; Take  1-2 tablets (10-20 mg total) by mouth at bedtime as needed.  Dispense: 30 tablet; Refill: 0  General Counseling: Mieko verbalizes understanding of the findings of todays visit and agrees with plan of treatment. I have discussed any further diagnostic evaluation that may be needed or ordered today. We also reviewed her medications today. she has been encouraged to call the office with any questions or concerns that should arise related to todays visit.  Orders Placed This Encounter  Procedures  . Ambulatory referral to Vascular Surgery    Meds ordered this encounter  Medications  . hydrOXYzine (ATARAX/VISTARIL) 10 MG tablet    Sig: Take 1-2 tablets (10-20 mg total) by mouth at bedtime as needed.    Dispense:  30 tablet    Refill:  0    Time spent: 30 Minutes   This patient was seen by Blima Ledger AGNP-C in Collaboration with Dr Lyndon Code as a part of collaborative care agreement  Johnna Acosta AGNP-C Internal Medicine

## 2020-02-26 ENCOUNTER — Telehealth: Payer: Self-pay | Admitting: Adult Health

## 2020-03-02 ENCOUNTER — Other Ambulatory Visit: Payer: Self-pay | Admitting: Adult Health

## 2020-03-02 DIAGNOSIS — R928 Other abnormal and inconclusive findings on diagnostic imaging of breast: Secondary | ICD-10-CM | POA: Diagnosis not present

## 2020-03-02 DIAGNOSIS — N6489 Other specified disorders of breast: Secondary | ICD-10-CM

## 2020-03-04 ENCOUNTER — Other Ambulatory Visit: Payer: Self-pay | Admitting: Adult Health

## 2020-03-06 ENCOUNTER — Other Ambulatory Visit: Payer: Self-pay

## 2020-03-06 ENCOUNTER — Telehealth: Payer: Self-pay

## 2020-03-06 DIAGNOSIS — F439 Reaction to severe stress, unspecified: Secondary | ICD-10-CM

## 2020-03-06 LAB — CBC WITH DIFFERENTIAL/PLATELET
Basophils Absolute: 0 10*3/uL (ref 0.0–0.2)
Basos: 1 %
EOS (ABSOLUTE): 0.1 10*3/uL (ref 0.0–0.4)
Eos: 2 %
Hematocrit: 40.7 % (ref 34.0–46.6)
Hemoglobin: 13.2 g/dL (ref 11.1–15.9)
Immature Grans (Abs): 0 10*3/uL (ref 0.0–0.1)
Immature Granulocytes: 0 %
Lymphocytes Absolute: 1.9 10*3/uL (ref 0.7–3.1)
Lymphs: 56 %
MCH: 29.1 pg (ref 26.6–33.0)
MCHC: 32.4 g/dL (ref 31.5–35.7)
MCV: 90 fL (ref 79–97)
Monocytes Absolute: 0.3 10*3/uL (ref 0.1–0.9)
Monocytes: 9 %
Neutrophils Absolute: 1.1 10*3/uL — ABNORMAL LOW (ref 1.4–7.0)
Neutrophils: 32 %
Platelets: 325 10*3/uL (ref 150–450)
RBC: 4.54 x10E6/uL (ref 3.77–5.28)
RDW: 13.7 % (ref 11.7–15.4)
WBC: 3.3 10*3/uL — ABNORMAL LOW (ref 3.4–10.8)

## 2020-03-06 LAB — FERRITIN: Ferritin: 221 ng/mL — ABNORMAL HIGH (ref 15–150)

## 2020-03-06 LAB — LIPID PANEL WITH LDL/HDL RATIO
Cholesterol, Total: 257 mg/dL — ABNORMAL HIGH (ref 100–199)
HDL: 76 mg/dL (ref >39)
LDL Chol Calc (NIH): 158 mg/dL — ABNORMAL HIGH (ref 0–99)
LDL/HDL Ratio: 2.1 ratio (ref 0.0–3.2)
Triglycerides: 134 mg/dL (ref 0–149)
VLDL Cholesterol Cal: 23 mg/dL (ref 5–40)

## 2020-03-06 LAB — COMPREHENSIVE METABOLIC PANEL
ALT: 64 IU/L — ABNORMAL HIGH (ref 0–32)
AST: 43 IU/L — ABNORMAL HIGH (ref 0–40)
Albumin/Globulin Ratio: 2 (ref 1.2–2.2)
Albumin: 4.9 g/dL — ABNORMAL HIGH (ref 3.8–4.8)
Alkaline Phosphatase: 59 IU/L (ref 48–121)
BUN/Creatinine Ratio: 8 — ABNORMAL LOW (ref 9–23)
BUN: 6 mg/dL (ref 6–24)
Bilirubin Total: <0.2 mg/dL (ref 0.0–1.2)
CO2: 23 mmol/L (ref 20–29)
Calcium: 10 mg/dL (ref 8.7–10.2)
Chloride: 102 mmol/L (ref 96–106)
Creatinine, Ser: 0.71 mg/dL (ref 0.57–1.00)
GFR calc Af Amer: 121 mL/min/{1.73_m2} (ref >59)
GFR calc non Af Amer: 105 mL/min/{1.73_m2} (ref >59)
Globulin, Total: 2.5 g/dL (ref 1.5–4.5)
Glucose: 96 mg/dL (ref 65–99)
Potassium: 4.4 mmol/L (ref 3.5–5.2)
Sodium: 142 mmol/L (ref 134–144)
Total Protein: 7.4 g/dL (ref 6.0–8.5)

## 2020-03-06 LAB — IRON AND TIBC
Iron Saturation: 25 % (ref 15–55)
Iron: 84 ug/dL (ref 27–159)
Total Iron Binding Capacity: 332 ug/dL (ref 250–450)
UIBC: 248 ug/dL (ref 131–425)

## 2020-03-06 LAB — B12 AND FOLATE PANEL
Folate: 11.3 ng/mL (ref >3.0)
Vitamin B-12: 1005 pg/mL (ref 232–1245)

## 2020-03-06 LAB — HGB A1C W/O EAG: Hgb A1c MFr Bld: 6.1 % — ABNORMAL HIGH (ref 4.8–5.6)

## 2020-03-06 LAB — HIV ANTIBODY (ROUTINE TESTING W REFLEX): HIV Screen 4th Generation wRfx: NONREACTIVE

## 2020-03-06 LAB — HCV AB W REFLEX TO QUANT PCR: HCV Ab: <0.1 s/co ratio (ref 0.0–0.9)

## 2020-03-06 LAB — TSH: TSH: 0.594 u[IU]/mL (ref 0.450–4.500)

## 2020-03-06 LAB — VITAMIN D 25 HYDROXY (VIT D DEFICIENCY, FRACTURES): Vit D, 25-Hydroxy: 22.7 ng/mL — ABNORMAL LOW (ref 30.0–100.0)

## 2020-03-06 LAB — T4, FREE: Free T4: 1.2 ng/dL (ref 0.82–1.77)

## 2020-03-06 LAB — HCV INTERPRETATION

## 2020-03-06 MED ORDER — HYDROXYZINE HCL 10 MG PO TABS: 10.0000 mg | ORAL_TABLET | Freq: Every evening | ORAL | 0 refills | Status: DC | PRN

## 2020-03-06 NOTE — Telephone Encounter (Signed)
Confirmed and screened for office visit 8/10 

## 2020-03-10 ENCOUNTER — Encounter: Payer: Self-pay | Admitting: Adult Health

## 2020-03-10 ENCOUNTER — Ambulatory Visit: Payer: BC Managed Care – PPO | Admitting: Adult Health

## 2020-03-10 ENCOUNTER — Other Ambulatory Visit: Payer: Self-pay

## 2020-03-10 VITALS — BP 118/86 | HR 86 | Temp 98.1°F | Resp 16 | Ht 66.0 in | Wt 181.8 lb

## 2020-03-10 DIAGNOSIS — F439 Reaction to severe stress, unspecified: Secondary | ICD-10-CM

## 2020-03-10 DIAGNOSIS — R7989 Other specified abnormal findings of blood chemistry: Secondary | ICD-10-CM

## 2020-03-10 DIAGNOSIS — G4719 Other hypersomnia: Secondary | ICD-10-CM

## 2020-03-10 DIAGNOSIS — F419 Anxiety disorder, unspecified: Secondary | ICD-10-CM

## 2020-03-10 DIAGNOSIS — R7303 Prediabetes: Secondary | ICD-10-CM

## 2020-03-10 DIAGNOSIS — R748 Abnormal levels of other serum enzymes: Secondary | ICD-10-CM | POA: Diagnosis not present

## 2020-03-10 DIAGNOSIS — R0683 Snoring: Secondary | ICD-10-CM

## 2020-03-10 MED ORDER — ESCITALOPRAM OXALATE 10 MG PO TABS
10.0000 mg | ORAL_TABLET | Freq: Every day | ORAL | 1 refills | Status: DC
Start: 2020-03-10 — End: 2020-04-07

## 2020-03-10 NOTE — Progress Notes (Signed)
[facility] [address] [place], [place] [address]  Internal MEDICINE  Office Visit Note  Patient Name: Christine Fry  [id number]  [id number]  Date of Service: 03/10/2020  Chief Complaint  Patient presents with  . Follow-up    3 week meds, labs and mammo resutls    HPI  Pt is here for follow up on mammogram results.  She had a diagnostic mammogram performed that is benign, and they are recommending another bilateral mammogram next year. She also brought her labs that her work drew, and she has elevated liver enzymes.  Her Ferritin is also elevated at 221.  Her AlC is also 6.1.  Her cholesterol is elevated at 257, with and LDL of 158.  We discussed dietary management of cholesterol.   She has been taking the hydroxyzine to help with with anxiety/stress. She also reports her husband is complaining about her snoring.  It is very loud and it sounds like she is choking at times.  She even has pauses, where she stops breathing according to him.  She does wake up tired most days, and has excessive daytime sleepiness.   Current Medication: Outpatient Encounter Medications as of 03/10/2020  Medication Sig  . estradiol (ESTRACE) 0.1 MG/GM vaginal cream Insert 1 Gm twice a week  . [DISCONTINUED] hydrOXYzine (ATARAX/VISTARIL) 10 MG tablet Take 1-2 tablets (10-20 mg total) by mouth at bedtime as needed.  Marland Kitchen escitalopram (LEXAPRO) 10 MG tablet Take 1 tablet (10 mg total) by mouth daily.   No facility-administered encounter medications on file as of 03/10/2020.    Surgical History: Past Surgical History:  Procedure Laterality Date  . BILATERAL SALPINGOOPHORECTOMY  2010   with LSH by Dr Luella Cook for [facility] and osis  . BREAST SURGERY     left breat in 2015/2016 clogged milk duct  . BREAST SURGERY Left 11/14/14   excision  . CESAREAN SECTION     x1  . CESAREAN SECTION  2005  . COLPOSCOPY  2013  . LAPAROSCOPIC SUPRACERVICAL HYSTERECTOMY  07/16/2009   LSH/BSO for leiomyoma  and adenomyosis Dr Luella Cook    Medical History: Past Medical History:  Diagnosis Date  . Acid reflux   . Allergy    used to get allergy shots with ENT last in 2015  . Endometriosis   . Environmental allergies   . Family history of breast cancer 10/2017   cancer genetic testing letter sent  . History of abnormal cervical Pap smear 10/14/2011   LGSIL  . HLD (hyperlipidemia)   . Nipple discharge in female 01/03/2013    Family History: Family History  Problem Relation Age of Onset  . Diabetes Father   . Hypertension Father   . Stroke Father   . Hyperlipidemia Father   . Breast cancer Paternal Aunt 73  . Diabetes Paternal Grandmother   . Diabetes Paternal Grandfather   . Diabetes Maternal Aunt        several aunts with diabetes and HTN  . Hypertension Maternal Aunt   . Diabetes Maternal Uncle        several uncles with diabetes and HTN  . Hypertension Maternal Uncle   . Sickle cell anemia Maternal Aunt   . Aneurysm Father        brain  . Hypertension Maternal Grandfather   . Diabetes Maternal Aunt        x 3     Social History   Socioeconomic History  . Marital status: Significant Other    Spouse name:  Not on file  . Number of children: 2  . Years of education: 68  . Highest education level: Not on file  Occupational History  . 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 Smoking status: Never Smoker  . Smokeless tobacco: Never Used  Vaping Use  . Vaping Use: Never used  Substance and Sexual Activity  . Alcohol use: Yes    Comment: occasionally  . Drug use: No  . Sexual activity: Yes    Birth control/protection: Surgical  Other Topics Concern  . Not on file  Social History Narrative   ** Merged History Encounter **       3 sons works age 31, 53 and 57 as of 02/13/2019   Sharmet since 2018  Never smoker  D[employer]     Social Determinants of [employer] Strain:   . Difficulty of Paying Living Expenses:   Food Insecurity:   . Worried  About Programme researcher, broadcasting/film/video in the Last Year:   . Barista in the Last Year:   Transportation Needs:   . Freight forwarder (Medical):   Marland Kitchen Lack of Transportation (Non-Medical):   Physical Activity:   . Days of Exercise per Week:   . Minutes of Exercise per Session:   Stress:   . Feeling of Stress :   Social Connections:   . Frequency of Communication with Friends and Family:   . Frequency of Social Gatherings with Friends and Family:   . Attends Religious Services:   . Active Member of Clubs or Organizations:   . Attends Banker Meetings:   Marland Kitchen Marital Status:   Intimate Partner Violence:   . Fear of Current or Ex-Partner:   . Emotionally Abused:   Marland Kitchen Physically Abused:   . Sexually Abused:       Review of Systems  Constitutional: Negative for chills, fatigue and unexpected weight change.  HENT: Negative for congestion, rhinorrhea, sneezing and sore throat.   Eyes: Negative for photophobia, pain and redness.  Respiratory: Negative for cough, chest tightness and shortness of breath.   Cardiovascular: Negative for chest pain and palpitations.  Gastrointestinal: Negative for abdominal pain, constipation, diarrhea, nausea and vomiting.  Endocrine: Negative.   Genitourinary: Negative for dysuria and frequency.  Musculoskeletal: Negative for arthralgias, back pain, joint swelling and neck pain.  Skin: Negative for rash.  Allergic/Immunologic: Negative.   Neurological: Negative for tremors and numbness.  Hematological: Negative for adenopathy. Does not bruise/bleed easily.  Psychiatric/Behavioral: Negative for behavioral problems and sleep disturbance. The patient is not nervous/anxious.     Vital Signs: BP 118/86   Pulse 86   Temp 98.1 F (36.7 C)   Resp 16   Ht 5\' 6"  (1.676 m)   Wt 181 lb 12.8 oz (82.5 kg)   LMP 06/11/2016 (Approximate)   SpO2 98%   BMI 29.34 kg/m    Physical Exam Vitals and nursing note reviewed.  Constitutional:       General: She is not in acute distress.    Appearance: She is well-developed. She is not diaphoretic.  HENT:     Head: Normocephalic and atraumatic.     Mouth/Throat:     Pharynx: No oropharyngeal exudate.  Eyes:     Pupils: Pupils are equal, round, and reactive to light.  Neck:     Thyroid: No thyromegaly.     Vascular: No JVD.     Trachea: No tracheal deviation.  Cardiovascular:  Rate and Rhythm: Normal rate and regular rhythm.     Heart sounds: Normal heart sounds. No murmur heard.  No friction rub. No gallop.   Pulmonary:     Effort: Pulmonary effort is normal. No respiratory distress.     Breath sounds: Normal breath sounds. No wheezing or rales.  Chest:     Chest wall: No tenderness.  Abdominal:     Palpations: Abdomen is soft.     Tenderness: There is no abdominal tenderness. There is no guarding.  Musculoskeletal:        General: Normal range of motion.     Cervical back: Normal range of motion and neck supple.  Lymphadenopathy:     Cervical: No cervical adenopathy.  Skin:    General: Skin is warm and dry.  Neurological:     Mental Status: She is alert and oriented to person, place, and time.     Cranial Nerves: No cranial nerve deficit.  Psychiatric:        Behavior: Behavior normal.        Thought Content: Thought content normal.        Judgment: Judgment normal.    Assessment/Plan: 1. Pre-diabetes Discussed lifestyle modifications to help lower A1C.  Discussed soda drinking, as well as sweets and carbohydrates.   2. Elevated liver enzymes Elevated Liver enzymes on lab work.  - US Abdomen Complete; Future  3. Elevated ferritin level Ferritin elevated, will recheck level after Liver US.  4. Stress Stop hydroxyzine, and start lexapro.  - escitalopram (LEXAPRO) 10 MG tablet; Take 1 tablet (10 mg total) by mouth daily.  Dispense: 30 tablet; Refill: 1  5. Anxiety - escitalopram (LEXAPRO) 10 MG tablet; Take 1 tablet (10 mg total) by mouth daily.   Dispense: 30 tablet; Refill: 1  6. Loud snoring - Home sleep test  7. Excessive daytime sleepiness - Home sleep test  General Counseling: Katarina verbalizes understanding of the findings of todays visit and agrees with plan of treatment. I have discussed any further diagnostic evaluation that may be needed or ordered today. We also reviewed her medications today. she has been encouraged to call the office with any questions or concerns that should arise related to todays visit.    Orders Placed This Encounter  Procedures  . US Abdomen Complete  . Home sleep test    Meds ordered this encounter  Medications  . escitalopram (LEXAPRO) 10 MG tablet    Sig: Take 1 tablet (10 mg total) by mouth daily.    Dispense:  30 tablet    Refill:  1    Time spent: 30 Minutes   This patient was seen by Blima Ledger AGNP-C in Collaboration with Dr Lyndon Code as a part of collaborative care agreement     Johnna Acosta AGNP-C Internal medicine

## 2020-03-11 ENCOUNTER — Other Ambulatory Visit: Payer: Self-pay

## 2020-03-16 ENCOUNTER — Other Ambulatory Visit (INDEPENDENT_AMBULATORY_CARE_PROVIDER_SITE_OTHER): Payer: Self-pay | Admitting: Nurse Practitioner

## 2020-03-16 DIAGNOSIS — I8312 Varicose veins of left lower extremity with inflammation: Secondary | ICD-10-CM

## 2020-03-17 NOTE — Progress Notes (Signed)
Please make follow up app with TH to discuss labs

## 2020-03-17 NOTE — Telephone Encounter (Signed)
Pt has already got her results. Christine Fry

## 2020-03-18 ENCOUNTER — Encounter (INDEPENDENT_AMBULATORY_CARE_PROVIDER_SITE_OTHER): Payer: BC Managed Care – PPO | Admitting: Nurse Practitioner

## 2020-03-18 ENCOUNTER — Other Ambulatory Visit: Payer: Self-pay

## 2020-03-18 ENCOUNTER — Encounter (INDEPENDENT_AMBULATORY_CARE_PROVIDER_SITE_OTHER): Payer: BC Managed Care – PPO

## 2020-03-18 ENCOUNTER — Ambulatory Visit (INDEPENDENT_AMBULATORY_CARE_PROVIDER_SITE_OTHER): Payer: BC Managed Care – PPO

## 2020-03-18 ENCOUNTER — Ambulatory Visit (INDEPENDENT_AMBULATORY_CARE_PROVIDER_SITE_OTHER): Payer: BC Managed Care – PPO | Admitting: Nurse Practitioner

## 2020-03-18 ENCOUNTER — Encounter (INDEPENDENT_AMBULATORY_CARE_PROVIDER_SITE_OTHER): Payer: Self-pay | Admitting: Nurse Practitioner

## 2020-03-18 VITALS — BP 124/83 | HR 84 | Resp 16 | Ht 66.0 in | Wt 181.0 lb

## 2020-03-18 DIAGNOSIS — I8312 Varicose veins of left lower extremity with inflammation: Secondary | ICD-10-CM | POA: Diagnosis not present

## 2020-03-18 NOTE — Progress Notes (Signed)
Subjective:    Patient ID: Christine Fry, female    DOB: May 24, 1976, 44 y.o.   MRN: [id number] Chief Complaint  Patient presents with  . New Patient (Initial Visit)    ref [place] varicose veins w/pain    The patient is seen for evaluation of symptomatic varicose veins. The patient relates burning and stinging which worsened steadily throughout the course of the day, particularly with standing. The patient also notes an aching and throbbing pain over the varicosities, particularly with prolonged dependent positions. The symptoms are significantly improved with elevation.  The patient also notes that during hot weather the symptoms are greatly intensified. The patient states the pain from the varicose veins interferes with work, daily exercise, shopping and household maintenance. At this point, the symptoms are persistent and severe enough that they're having a negative impact on lifestyle and are interfering with daily activities.  There is no history of DVT, PE or superficial thrombophlebitis. There is no history of ulceration or hemorrhage. The patient denies a significant family history of varicose veins. OB history: [id number]  The patient has worn graduated compression in the past as they are recommended by her job. Despite use of compression stockings, the patient continues to have pain and discomfort.  At the present time the patient has been using over-the-counter analgesics. There is no history of prior surgical intervention or sclerotherapy.  No evidence of DVT seen in the left lower extremity.  No evidence of superficial thrombosis in the left lower extremity.  The patient has evidence of superficial venous reflux noted in the left great saphenous vein from the proximal thigh extending to the distal thigh.   Review of Systems  Cardiovascular: Positive for leg swelling.  All other systems reviewed and are negative.      Objective:   Physical Exam Vitals reviewed.    HENT:     Head: Normocephalic.  Cardiovascular:     Rate and Rhythm: Normal rate and regular rhythm.     Pulses: Normal pulses.     Comments: Large varicosity on Left lower extremity  Pulmonary:     Effort: Pulmonary effort is normal.  Musculoskeletal:        General: Tenderness present.  Skin:    General: Skin is warm and dry.  Neurological:     Mental Status: She is alert and oriented to person, place, and time.  Psychiatric:        Mood and Affect: Mood normal.        Behavior: Behavior normal.        Thought Content: Thought content normal.        Judgment: Judgment normal.     BP 124/83 (BP Location: Right Arm)   Pulse 84   Resp 16   Ht 5\' 6"  (1.676 m)   Wt 181 lb (82.1 kg)   LMP 06/11/2016 (Approximate)   BMI 29.21 kg/m   Past Medical History:  Diagnosis Date  . Acid reflux   . Allergy    used to get allergy shots with ENT last in 2015  . Endometriosis   . Environmental allergies   . Family history of breast cancer 10/2017   cancer genetic testing letter sent  . History of abnormal cervical Pap smear 10/14/2011   LGSIL  . HLD (hyperlipidemia)   . Nipple discharge in female 01/03/2013    Social History   Socioeconomic History  . Marital status: Significant Other    Spouse name: Not on file  . Number  of children: 2  . Years of education: 73  . Highest education level: Not on file  Occupational History  . 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 Smoking status: Never Smoker  . Smokeless tobacco: Never Used  Vaping Use  . Vaping Use: Never used  Substance and Sexual Activity  . Alcohol use: Yes    Comment: occasionally  . Drug use: No  . Sexual activity: Yes    Birth control/protection: Surgical  Other Topics Concern  . Not on file  Social History Narrative   ** Merged History Encounter **       3 sons works age 101, 24 and 5 as of 02/13/2019   Sharmet since 2018  Never smoker  D[employer]     Social Determinants of [employer] Strain:   . Difficulty of Paying Living Expenses:   Food Insecurity:   . Worried About Programme researcher, broadcasting/film/video in the Last Year:   . Barista in the Last Year:   Transportation Needs:   . Freight forwarder (Medical):   Marland Kitchen Lack of Transportation (Non-Medical):   Physical Activity:   . Days of Exercise per Week:   . Minutes of Exercise per Session:   Stress:   . Feeling of Stress :   Social Connections:   . Frequency of Communication with Friends and Family:   . Frequency of Social Gatherings with Friends and Family:   . Attends Religious Services:   . Active Member of Clubs or Organizations:   . Attends Banker Meetings:   Marland Kitchen Marital Status:   Intimate Partner Violence:   . Fear of Current or Ex-Partner:   . Emotionally Abused:   Marland Kitchen Physically Abused:   . Sexually Abused:     Past Surgical History:  Procedure Laterality Date  . BILATERAL SALPINGOOPHORECTOMY  2010   with LSH by Dr Luella Cook for [facility] and osis  . BREAST SURGERY     left breat in 2015/2016 clogged milk duct  . BREAST SURGERY Left 11/14/14   excision  . CESAREAN SECTION     x1  . CESAREAN SECTION  2005  . COLPOSCOPY  2013  . LAPAROSCOPIC SUPRACERVICAL HYSTERECTOMY  07/16/2009   LSH/BSO for leiomyoma and adenomyosis Dr Luella Cook    Family History  Problem Relation Age of Onset  . Diabetes Father   . Hypertension Father   . Stroke Father   . Hyperlipidemia Father   . Breast cancer Paternal Aunt 14  . Diabetes Paternal Grandmother   . Diabetes Paternal Grandfather   . Diabetes Maternal Aunt        several aunts with diabetes and HTN  . Hypertension Maternal Aunt   . Diabetes Maternal Uncle        several uncles with diabetes and HTN  . Hypertension Maternal Uncle   . Sickle cell anemia Maternal Aunt   . Aneurysm Father        brain  . Hypertension Maternal Grandfather   . Diabetes Maternal Aunt        x 3     Allergies  Allergen Reactions  . Sulfa Antibiotics     Mouth  lips swell  . Latex     Rash hives   . Penicillins Hives  . Penicillins   . Shellfish Allergy Nausea Only  . Shellfish Allergy     Itching   . Sulfa Antibiotics Nausea And Vomiting  . Sulfasalazine Nausea And  Vomiting  . Latex Rash       Assessment & Plan:   1. Varicose veins of left lower extremity with inflammation Recommend  I have reviewed my previous  discussion with the patient regarding  varicose veins and why they cause symptoms. Patient will continue  wearing graduated compression stockings class 1 on a daily basis, beginning first thing in the morning and removing them in the evening.    In addition, behavioral modification including elevation during the day was again discussed and this will continue.  The patient has utilized over the counter pain medications and has been exercising.  However, at this time conservative therapy has not alleviated the patient's symptoms of leg pain and swelling  Recommend: laser ablation of the left great saphenous veins to eliminate the symptoms of pain and swelling of the lower extremities caused by the severe superficial venous reflux disease.      Current Outpatient Medications on File Prior to Visit  Medication Sig Dispense Refill  . escitalopram (LEXAPRO) 10 MG tablet Take 1 tablet (10 mg total) by mouth daily. 30 tablet 1  . estradiol (ESTRACE) 0.1 MG/GM vaginal cream Insert 1 Gm twice a week 42.5 g 3   No current facility-administered medications on file prior to visit.    There are no Patient Instructions on file for this visit. No follow-ups on file.   Georgiana Spinner, NP

## 2020-04-03 ENCOUNTER — Ambulatory Visit (INDEPENDENT_AMBULATORY_CARE_PROVIDER_SITE_OTHER): Payer: BC Managed Care – PPO

## 2020-04-03 ENCOUNTER — Other Ambulatory Visit: Payer: Self-pay

## 2020-04-03 DIAGNOSIS — R748 Abnormal levels of other serum enzymes: Secondary | ICD-10-CM

## 2020-04-07 ENCOUNTER — Other Ambulatory Visit: Payer: Self-pay

## 2020-04-07 DIAGNOSIS — F439 Reaction to severe stress, unspecified: Secondary | ICD-10-CM

## 2020-04-07 DIAGNOSIS — F419 Anxiety disorder, unspecified: Secondary | ICD-10-CM

## 2020-04-07 MED ORDER — ESCITALOPRAM OXALATE 10 MG PO TABS: 10.0000 mg | ORAL_TABLET | Freq: Every day | ORAL | 1 refills | Status: DC

## 2020-04-08 ENCOUNTER — Telehealth: Payer: Self-pay

## 2020-04-08 NOTE — Telephone Encounter (Signed)
Left a message and asked pt to call and schedule appointment for home sleep study and follow up NP for results. Beth

## 2020-04-09 ENCOUNTER — Other Ambulatory Visit: Payer: Self-pay

## 2020-04-09 ENCOUNTER — Other Ambulatory Visit: Payer: Self-pay | Admitting: Adult Health

## 2020-04-09 DIAGNOSIS — F439 Reaction to severe stress, unspecified: Secondary | ICD-10-CM

## 2020-04-09 DIAGNOSIS — G4733 Obstructive sleep apnea (adult) (pediatric): Secondary | ICD-10-CM

## 2020-04-09 DIAGNOSIS — F419 Anxiety disorder, unspecified: Secondary | ICD-10-CM

## 2020-04-10 ENCOUNTER — Ambulatory Visit (INDEPENDENT_AMBULATORY_CARE_PROVIDER_SITE_OTHER): Payer: BC Managed Care – PPO | Admitting: Adult Health

## 2020-04-10 ENCOUNTER — Telehealth: Payer: Self-pay

## 2020-04-10 ENCOUNTER — Other Ambulatory Visit: Payer: BC Managed Care – PPO

## 2020-04-10 ENCOUNTER — Encounter: Payer: Self-pay | Admitting: Adult Health

## 2020-04-10 VITALS — Resp 16 | Ht 66.0 in | Wt 172.0 lb

## 2020-04-10 DIAGNOSIS — M5440 Lumbago with sciatica, unspecified side: Secondary | ICD-10-CM | POA: Diagnosis not present

## 2020-04-10 DIAGNOSIS — M25532 Pain in left wrist: Secondary | ICD-10-CM

## 2020-04-10 MED ORDER — CYCLOBENZAPRINE HCL 10 MG PO TABS: 10.0000 mg | ORAL_TABLET | Freq: Two times a day (BID) | ORAL | 0 refills | Status: DC | PRN

## 2020-04-10 MED ORDER — PREDNISONE 10 MG PO TABS: ORAL_TABLET | ORAL | 0 refills | Status: DC

## 2020-04-10 NOTE — Progress Notes (Signed)
U/S of the abdomen is WNL, will discuss with her on next follow up. Please advise pt to keep all her app

## 2020-04-10 NOTE — Telephone Encounter (Signed)
-----   Message from Lyndon Code, MD sent at 04/10/2020  7:36 AM EDT ----- U/S of the abdomen is WNL, will discuss with her on next follow up. Please advise pt to keep all her app

## 2020-04-10 NOTE — Progress Notes (Signed)
[facility] [address] [place], [place] [address]  Internal MEDICINE  Telephone Visit  Patient Name: Christine Fry  [id number]  [id number]  Date of Service: 04/10/2020  I connected with the patient at 907 by telephone and verified the patients identity using two identifiers.   I discussed the limitations, risks, security and privacy concerns of performing an evaluation and management service by telephone and the availability of in person appointments. I also discussed with the patient that there may be a patient responsible charge related to the service.  The patient expressed understanding and agrees to proceed.    Chief Complaint  Patient presents with  . Acute Visit    monday morning pt fell down steps, wrist and back pain  . Telephone Screen    [id number] line 2  . Telephone Assessment    phone or video call is fine    HPI  Pt seen via telephone.  She reports she was going down the stairs Monday morning, and fell over the dog.  She fell approximately 7-8 stairs. She reports left wrist and middle /low  back pain.  She reports she is able to move, but she does have some back spasm that runs down her leg.      Current Medication: Outpatient Encounter Medications as of 04/10/2020  Medication Sig  . escitalopram (LEXAPRO) 10 MG tablet Take 1 tablet (10 mg total) by mouth daily.  Marland Kitchen estradiol (ESTRACE) 0.1 MG/GM vaginal cream Insert 1 Gm twice a week  . cyclobenzaprine (FLEXERIL) 10 MG tablet Take 1 tablet (10 mg total) by mouth 2 (two) times daily as needed for muscle spasms.  . predniSONE (DELTASONE) 10 MG tablet Use per dose pack   No facility-administered encounter medications on file as of 04/10/2020.    Surgical History: Past Surgical History:  Procedure Laterality Date  . BILATERAL SALPINGOOPHORECTOMY  2010   with LSH by Dr Luella Cook for [facility] and osis  . BREAST SURGERY     left breat in 2015/2016 clogged milk duct  . BREAST SURGERY Left 11/14/14    excision  . CESAREAN SECTION     x1  . CESAREAN SECTION  2005  . COLPOSCOPY  2013  . LAPAROSCOPIC SUPRACERVICAL HYSTERECTOMY  07/16/2009   LSH/BSO for leiomyoma and adenomyosis Dr Luella Cook    Medical History: Past Medical History:  Diagnosis Date  . Acid reflux   . Allergy    used to get allergy shots with ENT last in 2015  . Endometriosis   . Environmental allergies   . Family history of breast cancer 10/2017   cancer genetic testing letter sent  . History of abnormal cervical Pap smear 10/14/2011   LGSIL  . HLD (hyperlipidemia)   . Nipple discharge in female 01/03/2013    Family History: Family History  Problem Relation Age of Onset  . Diabetes Father   . Hypertension Father   . Stroke Father   . Hyperlipidemia Father   . Breast cancer Paternal Aunt 49  . Diabetes Paternal Grandmother   . Diabetes Paternal Grandfather   . Diabetes Maternal Aunt        several aunts with diabetes and HTN  . Hypertension Maternal Aunt   . Diabetes Maternal Uncle        several uncles with diabetes and HTN  . Hypertension Maternal Uncle   . Sickle cell anemia Maternal Aunt   . Aneurysm Father        brain  . Hypertension Maternal Grandfather   .  Diabetes Maternal Aunt        x 3     Social History   Socioeconomic History  . Marital status: Significant Other    Spouse name: Not on file  . Number of children: 2  . Years of education: 71  . Highest education level: Not on file  Occupational History  . 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 Smoking status: Never Smoker  . Smokeless tobacco: Never Used  Vaping Use  . Vaping Use: Never used  Substance and Sexual Activity  . Alcohol use: Yes    Comment: occasionally  . Drug use: No  . Sexual activity: Yes    Birth control/protection: Surgical  Other Topics Concern  . Not on file  Social History Narrative   ** Merged History Encounter **       3 sons works age 20, 77 and 56 as of 02/13/2019   Sharmet since 2018  Never  smoker  [employer]     Social Determinants of [employer] Strain:   . Difficulty of Paying Living Expenses: Not on file  Food Insecurity:   . Worried About Programme researcher, broadcasting/film/video in the Last Year: Not on file  . Ran Out of Food in the Last Year: Not on file  Transportation Needs:   . Lack of Transportation (Medical): Not on file  . Lack of Transportation (Non-Medical): Not on file  Physical Activity:   . Days of Exercise per Week: Not on file  . Minutes of Exercise per Session: Not on file  Stress:   . Feeling of Stress : Not on file  Social Connections:   . Frequency of Communication with Friends and Family: Not on file  . Frequency of Social Gatherings with Friends and Family: Not on file  . Attends Religious Services: Not on file  . Active Member of Clubs or Organizations: Not on file  . Attends Banker Meetings: Not on file  . Marital Status: Not on file  Intimate Partner Violence:   . Fear of Current or Ex-Partner: Not on file  . Emotionally Abused: Not on file  . Physically Abused: Not on file  . Sexually Abused: Not on file      Review of Systems  Constitutional: Negative for chills, fatigue and unexpected weight change.  HENT: Negative for congestion, rhinorrhea, sneezing and sore throat.   Eyes: Negative for photophobia, pain and redness.  Respiratory: Negative for cough, chest tightness and shortness of breath.   Cardiovascular: Negative for chest pain and palpitations.  Gastrointestinal: Negative for abdominal pain, constipation, diarrhea, nausea and vomiting.  Endocrine: Negative.   Genitourinary: Negative for dysuria and frequency.  Musculoskeletal: Positive for back pain. Negative for arthralgias, joint swelling and neck pain.  Skin: Negative for rash.  Allergic/Immunologic: Negative.   Neurological: Negative for tremors and numbness.  Hematological: Negative for adenopathy. Does not bruise/bleed easily.   Psychiatric/Behavioral: Negative for behavioral problems and sleep disturbance. The patient is not nervous/anxious.     Vital Signs: Resp 16   Ht 5\' 6"  (1.676 m)   Wt 172 lb (78 kg)   LMP 06/11/2016 (Approximate)   BMI 27.76 kg/m    Observation/Objective:  well sounding, NAD noted.   Assessment/Plan: 1. Left wrist pain Rest, ice, NSAIDS.  If symptoms fail to improve call office for X-ray order.   2. Acute right-sided low back pain with sciatica, sciatica laterality unspecified Rest, heat for 20 minutes on 2 hours  off. Use nsaids as discussed.  May take prednisone and flexeril as discussed.  Avoid alcohol or narcotics with flexeril.  - cyclobenzaprine (FLEXERIL) 10 MG tablet; Take 1 tablet (10 mg total) by mouth 2 (two) times daily as needed for muscle spasms.  Dispense: 30 tablet; Refill: 0 - predniSONE (DELTASONE) 10 MG tablet; Use per dose pack  Dispense: 21 tablet; Refill: 0  General Counseling: Clydine verbalizes understanding of the findings of today's phone visit and agrees with plan of treatment. I have discussed any further diagnostic evaluation that may be needed or ordered today. We also reviewed her medications today. she has been encouraged to call the office with any questions or concerns that should arise related to todays visit.    No orders of the defined types were placed in this encounter.   Meds ordered this encounter  Medications  . cyclobenzaprine (FLEXERIL) 10 MG tablet    Sig: Take 1 tablet (10 mg total) by mouth 2 (two) times daily as needed for muscle spasms.    Dispense:  30 tablet    Refill:  0  . predniSONE (DELTASONE) 10 MG tablet    Sig: Use per dose pack    Dispense:  21 tablet    Refill:  0    Time spent: 25 Minutes    Blima Ledger AGNP-C Internal medicine

## 2020-04-10 NOTE — Telephone Encounter (Signed)
PT notified

## 2020-04-13 ENCOUNTER — Telehealth: Payer: Self-pay

## 2020-04-13 NOTE — Telephone Encounter (Signed)
CONFIRMED PT APPT 04/15/20 °

## 2020-04-15 ENCOUNTER — Ambulatory Visit (INDEPENDENT_AMBULATORY_CARE_PROVIDER_SITE_OTHER): Payer: BC Managed Care – PPO | Admitting: Internal Medicine

## 2020-04-15 ENCOUNTER — Other Ambulatory Visit: Payer: Self-pay

## 2020-04-15 DIAGNOSIS — G4719 Other hypersomnia: Secondary | ICD-10-CM | POA: Diagnosis not present

## 2020-04-19 NOTE — Procedures (Signed)
[facility] [address] [place], [place] [address]  Sleep Specialist: Yevonne Pax, MD [facility]  Home Sleep Study Interpretation  Patient Name: Christine Fry Billing Patient MR ENIDPO:[id number] DOB:[birth year]  Date of Study: April 16, 2020  Indications for study: Hypersomnia excessive daytime somnolence  BMI: 27.7 kg/m       Respiratory Data:  Total AHI: 1.6/h  Total Obstructive Apneas: 0  Total Central Apneas: 0  Total Mixed Apneas: 0  Total Hypopneas: 4  If the AHI is greater than 5 per hour patient qualifies for PAP evaluation  Oximetry Data:  Oxygen Desaturation Index: 3.6/h  Lowest Desaturation: 68%  Cardiac Data:  Minimum Heart Rate: 46  Maximum Heart Rate: 146   Impression / Diagnosis:  This apnea study is unremarkable as far as obstructive sleep apnea is concerned.  The overall AHI is 1.6/h and within normal limits.  Patient however does have significant oxygen desaturation with the lowest saturation of 68%.  A search for underlying cardiopulmonary disease is strongly recommended.  In addition the patient does have tachybradycardia arrhythmias with the lowest heart rate of 46 high heart rate of 146 which again would suggest work-up for cardiac abnormalities.  GENERAL Recommendations:  1.  Consider Auto PAP with pressure ranges 5-20 cmH20 with download, or facility based PAP Titration Study  2.  Consider PAP interface mask fitted for patient comfort, Heated Humidification & PAP compliance monitoring (1 month, 3 months & 12 months after PAP initiation)  3. Consider treatment with mandibular advancement splint (MAS) or referral to an ENT surgeon for modification to the upper airway if the patient prefers an alternate therapy or the PAP trial is unsuccessful  4. Sleep hygiene measures should be discussed with the patient  5. Behavioral therapy such as weight reduction or smoking cessation as appropriate for the patient  6. Advise  patient against the use of alcohol or sedatives in so much as these substances can worsen excessive daytime sleepiness and respiratory disturbances of sleep  7. Advise patient against participating in potentially dangerous activities while drowsy such as operating a motor vehicle, heavy equipment or power tools as it can put them and others in danger  8. Advise patient of the long term consequences of OSA if left untreated, need for treatment and close follow up  9. Clinical follow up as deemed necessary     This Level III home sleep study was performed using the [employer], a 4 channel screening device subject to limitations. Depending on actual total sleep time, not measured in this study, the AHI (sum of apneas and hypopneas/hr of sleep) and therefore the severity of sleep apnea may be underestimated. As with any single night study, including Level 1 attended PSG, severity of sleep apnea may also be underestimated due to the lack of supine and/or REM sleep.  The interpretation associated with this report is based on normal values and degrees of severity in accordance with AASM parameters and/or estimated from multiple sources in the literature for adults ages 30-80+. These may not agree with the displayed values. The patient's treating physician should use the interpretation and recommendations in conjunction with the overall clinical evaluation and treatment of the patient.  Some of the terminology used in this scored [facility]nea[facility] report was developed several years ago and may not always be in accordance with current nomenclature. This in no way affects the accuracy of the data or the reliability of the interpretation and recommendations.

## 2020-04-21 ENCOUNTER — Encounter: Payer: Self-pay | Admitting: Internal Medicine

## 2020-04-21 DIAGNOSIS — R519 Headache, unspecified: Secondary | ICD-10-CM | POA: Diagnosis not present

## 2020-04-21 DIAGNOSIS — Z03818 Encounter for observation for suspected exposure to other biological agents ruled out: Secondary | ICD-10-CM | POA: Diagnosis not present

## 2020-04-21 DIAGNOSIS — Z20822 Contact with and (suspected) exposure to covid-19: Secondary | ICD-10-CM | POA: Diagnosis not present

## 2020-04-21 NOTE — Progress Notes (Signed)
Scanned in lab report 03/2020

## 2020-05-05 ENCOUNTER — Ambulatory Visit: Payer: BC Managed Care – PPO | Admitting: Internal Medicine

## 2020-05-29 ENCOUNTER — Other Ambulatory Visit (INDEPENDENT_AMBULATORY_CARE_PROVIDER_SITE_OTHER): Payer: Self-pay | Admitting: Vascular Surgery

## 2020-06-01 ENCOUNTER — Encounter (INDEPENDENT_AMBULATORY_CARE_PROVIDER_SITE_OTHER): Payer: Self-pay

## 2020-06-15 ENCOUNTER — Ambulatory Visit: Payer: BC Managed Care – PPO | Admitting: Hospice and Palliative Medicine

## 2020-06-19 ENCOUNTER — Other Ambulatory Visit (INDEPENDENT_AMBULATORY_CARE_PROVIDER_SITE_OTHER): Payer: Self-pay | Admitting: Vascular Surgery

## 2020-06-22 ENCOUNTER — Encounter (INDEPENDENT_AMBULATORY_CARE_PROVIDER_SITE_OTHER): Payer: Self-pay

## 2020-10-12 DIAGNOSIS — Z20822 Contact with and (suspected) exposure to covid-19: Secondary | ICD-10-CM | POA: Diagnosis not present

## 2020-10-12 DIAGNOSIS — Z03818 Encounter for observation for suspected exposure to other biological agents ruled out: Secondary | ICD-10-CM | POA: Diagnosis not present

## 2020-11-30 ENCOUNTER — Ambulatory Visit: Payer: BC Managed Care – PPO | Admitting: Obstetrics

## 2021-01-14 ENCOUNTER — Other Ambulatory Visit: Payer: Self-pay

## 2021-01-14 ENCOUNTER — Ambulatory Visit (INDEPENDENT_AMBULATORY_CARE_PROVIDER_SITE_OTHER): Payer: BC Managed Care – PPO | Admitting: Dermatology

## 2021-01-14 DIAGNOSIS — L821 Other seborrheic keratosis: Secondary | ICD-10-CM | POA: Diagnosis not present

## 2021-01-14 DIAGNOSIS — L219 Seborrheic dermatitis, unspecified: Secondary | ICD-10-CM | POA: Diagnosis not present

## 2021-01-14 DIAGNOSIS — R61 Generalized hyperhidrosis: Secondary | ICD-10-CM

## 2021-01-14 MED ORDER — KETOCONAZOLE 2 % EX SHAM: 1.0000 "application " | MEDICATED_SHAMPOO | Freq: Once | CUTANEOUS | 6 refills | Status: AC

## 2021-01-14 MED ORDER — FLUOCINOLONE ACETONIDE BODY 0.01 % EX OIL: TOPICAL_OIL | CUTANEOUS | 6 refills | Status: DC

## 2021-01-14 MED ORDER — DRYSOL 20 % EX SOLN: Freq: Every day | CUTANEOUS | 6 refills | Status: DC

## 2021-01-14 NOTE — Progress Notes (Signed)
   New Patient Visit  Subjective  Christine Fry is a 45 y.o. female who presents for the following: Dermatitis (Refill Ketoconazole 2% shampoo and Dermasmooth fs oil for seborrheic dermatitis ) and Excessive Sweating (Refill Drysol for excessive sweating beneath the arms ).    The following portions of the chart were reviewed this encounter and updated as appropriate:        Review of Systems:  No other skin or systemic complaints except as noted in HPI or Assessment and Plan.  Objective  Well appearing patient in no apparent distress; mood and affect are within normal limits.  A focused examination was performed including face,scalp,axillae. Relevant physical exam findings are noted in the Assessment and Plan.  scalp Flaking on the scalp   axillae Moisture axilla  neck, face Tiny waxy brown papules    Assessment & Plan  Seborrheic dermatitis scalp  Seborrheic Dermatitis- controlled with treatment  -  is a chronic persistent rash characterized by pinkness and scaling most commonly of the mid face but also can occur on the scalp (dandruff), ears; mid chest and mid back. It tends to be exacerbated by stress and cooler weather.  People who have neurologic disease may experience new onset or exacerbation of existing seborrheic dermatitis.  The condition is not curable but treatable and can be controlled.  Cont Ketoconazole 2% shampoo apply to scalp once a week prn Cont Fluocinolone scalp oil apply to scalp twice a day prn irritation. Avoid applying to face, groin, and axilla. Use as directed.   Topical steroids (such as triamcinolone, fluocinolone, fluocinonide, mometasone, clobetasol, halobetasol, betamethasone, hydrocortisone) can cause thinning and lightening of the skin if they are used for too long in the same area. Your physician has selected the right strength medicine for your problem and area affected on the body. Please use your medication only as directed by  your physician to prevent side effects.     ketoconazole (NIZORAL) 2 % shampoo - scalp Apply 1 application topically once for 1 dose. apply three times per week, massage into scalp and leave in for 10 minutes before rinsing out  Fluocinolone Acetonide Body 0.01 % OIL - scalp apply to affected areas twice a day prn.  Avoid applying to face, groin, and axilla. Use as directed. Risk of skin atrophy with long-term use reviewed.  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  Pt does it 2x/week May cont wearing otc Deodorant on alternate days  aluminum chloride (DRYSOL) 20 % external solution - axillae Apply topically at bedtime.  Dermatosis papulosa nigra neck, face  Benign, observe.      If becomes bothersome pt will return to the office for treatment (ED)  Return in about 1 year (around 01/14/2022) for Seborrrheic dermatitis, hyperhidrosis.   I, Angelique Holm, CMA, am acting as scribe for [name], MD .   Documentation: I have reviewed the above documentation for accuracy and completeness, and I agree with the above.  [name] MD

## 2021-01-14 NOTE — Patient Instructions (Addendum)
If you have any questions or concerns for your doctor, please call our main line at [phone] and press option 4 to reach your doctor's medical assistant. If no one answers, please leave a voicemail as directed and we will return your call as soon as possible. Messages left after 4 pm will be answered the following business day.   You may also send us a message via MyChart. We typically respond to MyChart messages within 1-2 business days.  For prescription refills, please ask your pharmacy to contact our office. Our fax number is [phone].  If you have an urgent issue when the clinic is closed that cannot wait until the next business day, you can page your doctor at the number below.    Please note that while we do our best to be available for urgent issues outside of office hours, we are not available 24/7.   If you have an urgent issue and are unable to reach us, you may choose to seek medical care at your doctor's office, retail clinic, urgent care center, or emergency room.  If you have a medical emergency, please immediately call 911 or go to the emergency department.  Pager Numbers  - Dr. Kowalski: [phone]  - Dr. Moye: [phone]  - Dr. Stewart: [phone]  In the event of inclement weather, please call our main line at [phone] for an update on the status of any delays or closures.  Dermatology Medication Tips: Please keep the boxes that topical medications come in in order to help keep track of the instructions about where and how to use these. Pharmacies typically print the medication instructions only on the boxes and not directly on the medication tubes.   If your medication is too expensive, please contact our office at [phone] option 4 or send us a message through MyChart.   We are unable to tell what your co-pay for medications will be in advance as this is different depending on your insurance coverage. However, we may be able to find a substitute  medication at lower cost or fill out paperwork to get insurance to cover a needed medication.   If a prior authorization is required to get your medication covered by your insurance company, please allow us 1-2 business days to complete this process.  Drug prices often vary depending on where the prescription is filled and some pharmacies may offer cheaper prices.  The website www.goodrx.com contains coupons for medications through different pharmacies. The prices here do not account for what the cost may be with help from insurance (it may be cheaper with your insurance), but the website can give you the price if you did not use any insurance.  - You can print the associated coupon and take it with your prescription to the pharmacy.  - You may also stop by our office during regular business hours and pick up a GoodRx coupon card.  - If you need your prescription sent electronically to a different pharmacy, notify our office through [facility] MyChart or by phone at [phone] option 4.  Topical steroids (such as triamcinolone, fluocinolone, fluocinonide, mometasone, clobetasol, halobetasol, betamethasone, hydrocortisone) can cause thinning and lightening of the skin if they are used for too long in the same area. Your physician has selected the right strength medicine for your problem and area affected on the body. Please use your medication only as directed by your physician to prevent side effects.  

## 2021-02-15 ENCOUNTER — Other Ambulatory Visit: Payer: BC Managed Care – PPO | Admitting: Internal Medicine

## 2021-02-23 DIAGNOSIS — Z1231 Encounter for screening mammogram for malignant neoplasm of breast: Secondary | ICD-10-CM | POA: Diagnosis not present

## 2021-02-23 LAB — HM MAMMOGRAPHY: HM Mammogram: NORMAL (ref 0–4)

## 2021-03-08 ENCOUNTER — Other Ambulatory Visit: Payer: BC Managed Care – PPO | Admitting: Physician Assistant

## 2021-03-25 DIAGNOSIS — H16041 Marginal corneal ulcer, right eye: Secondary | ICD-10-CM | POA: Diagnosis not present

## 2021-03-27 DIAGNOSIS — H16041 Marginal corneal ulcer, right eye: Secondary | ICD-10-CM | POA: Diagnosis not present

## 2021-05-04 ENCOUNTER — Encounter: Payer: Self-pay | Admitting: Internal Medicine

## 2021-09-14 ENCOUNTER — Ambulatory Visit (INDEPENDENT_AMBULATORY_CARE_PROVIDER_SITE_OTHER): Payer: BC Managed Care – PPO | Admitting: Licensed Practical Nurse

## 2021-09-14 ENCOUNTER — Encounter: Payer: Self-pay | Admitting: Licensed Practical Nurse

## 2021-09-14 ENCOUNTER — Other Ambulatory Visit: Payer: Self-pay

## 2021-09-14 ENCOUNTER — Other Ambulatory Visit (HOSPITAL_COMMUNITY)
Admission: RE | Admit: 2021-09-14 | Discharge: 2021-09-14 | Disposition: A | Discharge status: Home / Self Care | Payer: BC Managed Care – PPO | Source: Ambulatory Visit | Attending: Licensed Practical Nurse | Admitting: Licensed Practical Nurse

## 2021-09-14 VITALS — BP 118/72 | Ht 66.0 in | Wt 184.0 lb

## 2021-09-14 DIAGNOSIS — R61 Generalized hyperhidrosis: Secondary | ICD-10-CM

## 2021-09-14 DIAGNOSIS — Z113 Encounter for screening for infections with a predominantly sexual mode of transmission: Secondary | ICD-10-CM | POA: Insufficient documentation

## 2021-09-14 DIAGNOSIS — Z124 Encounter for screening for malignant neoplasm of cervix: Secondary | ICD-10-CM | POA: Insufficient documentation

## 2021-09-14 DIAGNOSIS — N941 Unspecified dyspareunia: Secondary | ICD-10-CM | POA: Insufficient documentation

## 2021-09-14 DIAGNOSIS — Z13220 Encounter for screening for lipoid disorders: Secondary | ICD-10-CM

## 2021-09-14 DIAGNOSIS — N952 Postmenopausal atrophic vaginitis: Secondary | ICD-10-CM

## 2021-09-14 DIAGNOSIS — Z01419 Encounter for gynecological examination (general) (routine) without abnormal findings: Secondary | ICD-10-CM

## 2021-09-14 MED ORDER — ESTRADIOL 0.1 MG/GM VA CREA: TOPICAL_CREAM | VAGINAL | 3 refills | Status: DC

## 2021-09-14 NOTE — Progress Notes (Signed)
Gynecology Annual Exam  PCP: Mylinda Latina, PA-C  Chief Complaint: Here for annual exam, needs refill on estrogen cream    History of Present Illness: Patient is a 46 y.o. [id number] presents for annual exam. The patient has no complaints today.   LMP: Patient's last menstrual period was 06/11/2016 (approximate). Surgical menopause 2010    The patient is sexually active. She currently uses status post hysterectomy for contraception. She admits to occasional dyspareunia.  The patient does perform self breast exams.  There is notable family history of breast or ovarian cancer in her family.  The patient wears seatbelts: yes.   The patient has regular exercise: yes.   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  Wears glasses and contacts, last eye exam 1 year ago Last Dental exam 1 year ago   The patient denies current symptoms of depression.    Review of Systems: Review of Systems  Constitutional:        Night sweats 2-3 times per week   Eyes: Negative.   Respiratory: Negative.    Cardiovascular: Negative.   Gastrointestinal: Negative.   Genitourinary:        Occasional leaking of urine when bladder is full, sometimes when laughing, coughing, or exercising, wears a pad  Occasional vaginal dryness/irritation   Musculoskeletal:  Positive for joint pain.  Skin:  Positive for itching.  Neurological: Negative.   Endo/Heme/Allergies:        Hot/cold intolerance   Psychiatric/Behavioral: Negative.     Past Medical History:  Patient Active Problem List   Diagnosis Date Noted   Annual physical exam 02/13/2019   HLD (hyperlipidemia)    Surgical menopause, symptomatic 10/07/2017   History of abnormal cervical Pap smear 10/07/2017    Dyspareunia in female 10/07/2017   Nipple discharge in female 01/03/2013    Past Surgical History:  Past Surgical History:  Procedure Laterality Date   BILATERAL SALPINGOOPHORECTOMY  2010   with [place] by Dr Laurey Morale for [facility] and osis   BREAST SURGERY     left breat in 2015/2016 clogged milk duct   BREAST SURGERY Left 11/14/14   excision   CESAREAN SECTION     x1   CESAREAN SECTION  2005   COLPOSCOPY  2013   LAPAROSCOPIC SUPRACERVICAL HYSTERECTOMY  07/16/2009   LSH/BSO for leiomyoma and adenomyosis Dr Laurey Morale    Gynecologic History:  Patient's last menstrual period was 06/11/2016 (approximate). Contraception: status post hysterectomy Last Pap: Results were: 2020 no abnormalities  Last mammogram: 2021 Results were:  normal, has done at work by mobile unit   Obstetric History: G2P2002  Family History:  Family History  Problem Relation Age of Onset   Diabetes Father    Hypertension Father    Stroke Father    Hyperlipidemia Father    Breast cancer Paternal Aunt 39   Diabetes Paternal Grandmother    Diabetes Paternal Grandfather    Diabetes Maternal Aunt        several aunts with diabetes and HTN   Hypertension Maternal Aunt    Diabetes Maternal Uncle        several uncles with diabetes and HTN   Hypertension Maternal Uncle    Sickle cell anemia Maternal Aunt  Aneurysm Father        brain   Hypertension Maternal Grandfather    Diabetes Maternal Aunt        x 3     Social History:  Social History   Socioeconomic History   Marital status: Significant Other    Spouse name: Not on file   Number of children: 2   Years of education: 14   Highest education level: Not on file  Occupational History   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 TECH  Tobacco Use   Smoking status: Never   Smokeless tobacco: Never  Vaping Use   Vaping Use: Never used  Substance and Sexual Activity   Alcohol use: Yes    Comment: occasionally   Drug use: No   Sexual activity: Yes    Birth control/protection:  Surgical  Other Topics Concern   Not on file  Social History Narrative   ** Merged History Encounter **       3 sons works age 66, 55 and 18 as of 02/13/2019   [place] since 2018  Never smoker  [employer]     Social Determinants of Radio broadcast assistant Strain: Not on file  Food Insecurity: Not on file  Transportation Needs: Not on file  Physical Activity: Not on file  Stress: Not on file  Social Connections: Not on file  Intimate Partner Violence: Not on file    Allergies:  Allergies  Allergen Reactions   Sulfa Antibiotics     Mouth lips swell   Latex     Rash hives    Penicillins Hives   Penicillins    Shellfish Allergy Nausea Only   Shellfish Allergy     Itching    Sulfa Antibiotics Nausea And Vomiting   Sulfasalazine Nausea And Vomiting   Latex Rash    Medications: Prior to Admission medications   Medication Sig Start Date End Date Taking? Authorizing Provider  Fluocinolone Acetonide Body 0.01 % OIL apply to affected areas twice a day prn.  Avoid applying to face, groin, and axilla. Use as directed. Risk of skin atrophy with long-term use reviewed. 01/14/21  Yes Brendolyn Patty, MD  aluminum chloride (DRYSOL) 20 % external solution Apply topically at bedtime. Patient not taking: Reported on 09/14/2021 01/14/21   Brendolyn Patty, MD  cyclobenzaprine (FLEXERIL) 10 MG tablet Take 1 tablet (10 mg total) by mouth 2 (two) times daily as needed for muscle spasms. Patient not taking: Reported on 09/14/2021 04/10/20   Kendell Bane, NP  escitalopram (LEXAPRO) 10 MG tablet Take 1 tablet (10 mg total) by mouth daily. Patient not taking: Reported on 09/14/2021 04/07/20   Kendell Bane, NP  estradiol (ESTRACE) 0.1 MG/GM vaginal cream Insert 1 Gm twice a week 09/14/21   Alethea Terhaar, Nunzio Cobbs, CNM  predniSONE (DELTASONE) 10 MG tablet Use per dose pack Patient not taking: Reported on 09/14/2021 04/10/20   Kendell Bane, NP    Physical Exam Vitals: Blood pressure  118/72, height 5\' 6"  (1.676 m), weight 184 lb (83.5 kg), last menstrual period 06/11/2016.  General: NAD HEENT: normocephalic, anicteric Thyroid: no enlargement, no palpable nodules Pulmonary: No increased work of breathing, CTAB Cardiovascular: RRR, distal pulses 2+ Breast: Breast symmetrical, no tenderness, no palpable nodules or masses, no skin or nipple retraction present, no nipple discharge.  No axillary or supraclavicular lymphadenopathy. Abdomen: NABS, soft, non-tender, non-distended.  Umbilicus without lesions.  No hepatomegaly, splenomegaly or masses palpable. No evidence of hernia  Genitourinary:  External: Normal external  female genitalia.  Normal urethral meatus, normal Bartholin's and Skene's glands.    Vagina: Labia atrophic, Normal vaginal mucosa a little pink red in color-consistent with low estrogen, no evidence of prolapse.  Good tone   Cervix: Grossly normal in appearance, no bleeding   Rectal: deferred  Lymphatic: no evidence of inguinal lymphadenopathy Extremities: no edema, erythema, or tenderness Neurologic: Grossly intact Psychiatric: mood appropriate, affect full  Female chaperone present for pelvic and breast  portions of the physical exam    Assessment: 46 y.o. G2P2002 routine annual exam  Plan: Problem List Items Addressed This Visit   None Visit Diagnoses     Well woman exam    -  Primary   Relevant Medications   estradiol (ESTRACE) 0.1 MG/GM vaginal cream   Other Relevant Orders   Cytology - PAP   CBC w/Diff/Platelet   TSH + free T4   Hemoglobin A1c   Lipid panel   HEP, RPR, HIV Panel   Hepatitis C antibody   Screening examination for venereal disease       Relevant Orders   HEP, RPR, HIV Panel   Hepatitis C antibody   Cervical cancer screening       Relevant Orders   Cytology - PAP   Night sweats       Relevant Orders   TSH + free T4   Screening cholesterol level       Relevant Orders   Lipid panel   Vaginal atrophy       Relevant  Medications   estradiol (ESTRACE) 0.1 MG/GM vaginal cream       1) Mammogram - recommend yearly screening mammogram.  Mammogram  will get done at work   2) STI screening  wasoffered and accepted  3) ASCCP guidelines and rational discussed.  Patient opts for yearly screening interval  4) Contraception - the patient is currently using  status post hysterectomy.  She is  NA  5) Colonoscopy -- Screening recommended starting at age 77 for average risk individuals, age 63 for individuals deemed at increased risk (including African Americans) and recommended to continue until age 5.  For patient age 18-85 individualized approach is recommended.  Gold standard screening is via colonoscopy, Cologuard screening is an acceptable alternative for patient unwilling or unable to undergo colonoscopy.  "Colorectal cancer screening for average?risk adults: 2018 guideline update from the American Cancer Society"CA: A Cancer Journal for Clinicians: Dec 28, 2016   6) Routine healthcare maintenance including cholesterol, diabetes screening discussed Ordered today  7) Premarin sample given, estrace reordered.   8) reviewed keeping a bladder diary, note foods she eats and when leakage occurs, consider pelvic PT if urinary leakage become bothersome.   9) RTC in 1 year   Roberto Scales, [place], [facility] 09/14/2021, 10:15 AM

## 2021-09-15 LAB — CBC WITH DIFFERENTIAL/PLATELET
Basophils Absolute: 0 10*3/uL (ref 0.0–0.2)
Basos: 0 %
EOS (ABSOLUTE): 0 10*3/uL (ref 0.0–0.4)
Eos: 1 %
Hematocrit: 39.5 % (ref 34.0–46.6)
Hemoglobin: 13.2 g/dL (ref 11.1–15.9)
Immature Grans (Abs): 0 10*3/uL (ref 0.0–0.1)
Immature Granulocytes: 0 %
Lymphocytes Absolute: 1.6 10*3/uL (ref 0.7–3.1)
Lymphs: 64 %
MCH: 29.3 pg (ref 26.6–33.0)
MCHC: 33.4 g/dL (ref 31.5–35.7)
MCV: 88 fL (ref 79–97)
Monocytes Absolute: 0.2 10*3/uL (ref 0.1–0.9)
Monocytes: 7 %
Neutrophils Absolute: 0.7 10*3/uL — ABNORMAL LOW (ref 1.4–7.0)
Neutrophils: 28 %
Platelets: 276 10*3/uL (ref 150–450)
RBC: 4.51 x10E6/uL (ref 3.77–5.28)
RDW: 13.7 % (ref 11.7–15.4)
WBC: 2.6 10*3/uL — ABNORMAL LOW (ref 3.4–10.8)

## 2021-09-15 LAB — TSH+FREE T4
Free T4: 1.04 ng/dL (ref 0.82–1.77)
TSH: 0.6 u[IU]/mL (ref 0.450–4.500)

## 2021-09-15 LAB — LIPID PANEL
Chol/HDL Ratio: 4 ratio (ref 0.0–4.4)
Cholesterol, Total: 255 mg/dL — ABNORMAL HIGH (ref 100–199)
HDL: 64 mg/dL (ref >39)
LDL Chol Calc (NIH): 177 mg/dL — ABNORMAL HIGH (ref 0–99)
Triglycerides: 81 mg/dL (ref 0–149)
VLDL Cholesterol Cal: 14 mg/dL (ref 5–40)

## 2021-09-15 LAB — HEMOGLOBIN A1C
Est. average glucose Bld gHb Est-mCnc: 134 mg/dL
Hgb A1c MFr Bld: 6.3 % — ABNORMAL HIGH (ref 4.8–5.6)

## 2021-09-15 LAB — HEP, RPR, HIV PANEL
HIV Screen 4th Generation wRfx: NONREACTIVE
Hepatitis B Surface Ag: NEGATIVE
RPR Ser Ql: NONREACTIVE

## 2021-09-15 LAB — HEPATITIS C ANTIBODY: Hep C Virus Ab: NONREACTIVE

## 2021-09-17 LAB — CYTOLOGY - PAP
Chlamydia: NEGATIVE
Comment: NEGATIVE
Comment: NEGATIVE
Comment: NEGATIVE
Comment: NORMAL
Diagnosis: NEGATIVE
High risk HPV: NEGATIVE
Neisseria Gonorrhea: NEGATIVE
Trichomonas: NEGATIVE

## 2021-09-27 ENCOUNTER — Ambulatory Visit
Admission: RE | Admit: 2021-09-27 | Discharge: 2021-09-27 | Disposition: A | Discharge status: Home / Self Care | Payer: BC Managed Care – PPO | Source: Ambulatory Visit | Attending: Physician Assistant | Admitting: Physician Assistant

## 2021-09-27 ENCOUNTER — Encounter: Payer: Self-pay | Admitting: Physician Assistant

## 2021-09-27 ENCOUNTER — Ambulatory Visit (INDEPENDENT_AMBULATORY_CARE_PROVIDER_SITE_OTHER): Payer: BC Managed Care – PPO | Admitting: Physician Assistant

## 2021-09-27 ENCOUNTER — Other Ambulatory Visit: Payer: Self-pay

## 2021-09-27 ENCOUNTER — Ambulatory Visit
Admission: RE | Admit: 2021-09-27 | Discharge: 2021-09-27 | Disposition: A | Discharge status: Home / Self Care | Payer: BC Managed Care – PPO | Attending: Physician Assistant | Admitting: Physician Assistant

## 2021-09-27 VITALS — BP 106/65 | HR 80 | Temp 98.1°F | Resp 16 | Ht 66.0 in | Wt 183.0 lb

## 2021-09-27 DIAGNOSIS — M79605 Pain in left leg: Secondary | ICD-10-CM | POA: Diagnosis not present

## 2021-09-27 DIAGNOSIS — M545 Low back pain, unspecified: Secondary | ICD-10-CM | POA: Diagnosis not present

## 2021-09-27 MED ORDER — OXYCODONE HCL 5 MG PO CAPS: 5.0000 mg | ORAL_CAPSULE | Freq: Every day | ORAL | 0 refills | Status: DC | PRN

## 2021-09-27 MED ORDER — PREDNISONE 10 MG PO TABS: ORAL_TABLET | ORAL | 0 refills | Status: DC

## 2021-09-27 MED ORDER — METHYLPREDNISOLONE ACETATE 80 MG/ML IJ SUSP
80.0000 mg | Freq: Once | INTRAMUSCULAR | Status: AC
  Administered 2021-09-27: 80 mg via INTRAMUSCULAR

## 2021-09-27 MED ORDER — CYCLOBENZAPRINE HCL 10 MG PO TABS: 10.0000 mg | ORAL_TABLET | Freq: Every day | ORAL | 1 refills | Status: DC

## 2021-09-27 NOTE — Progress Notes (Signed)
[facility] [address] [place], [place] [address]  Internal MEDICINE  Office Visit Note  Patient Name: Christine Fry  [id number]  [id number]  Date of Service: 09/27/2021  Chief Complaint  Patient presents with   Back Pain    Sharp lower back pain - bent over to pick up barbell at gym and felt a snapping sensation, making left leg feel numb     HPI Pt is here for a sick visit. -Pt was at the gym about 30-45 minutes ago. Went to lift weight (35lb each side) which is normal for her, upon lifting felt a pop in her back has been experiencing severe pain since.  She does mention that it is worse on the left side and feels somewhat different on the left though denies any numbness.  She has never experienced anything like this before and came straight here from the gym.  She is still in significant pain and movements aggravated including rising from a seated position. - Discussed treatment options and will move forward with steroid injection in office and patient will go for lumbar x-ray.  If no worsening but also no improvement may need to go to [facility] for further evaluation. Discussed if worsening or any new symptoms such as incontinence or numbness and tingling along genital area will need to go to ED immediately.  Patient expressed understanding  Current Medication:  Outpatient Encounter Medications as of 09/27/2021  Medication Sig   cyclobenzaprine (FLEXERIL) 10 MG tablet Take 1 tablet (10 mg total) by mouth at bedtime. Take one tab po qhs for back spasm prn only   oxycodone (OXY-IR) 5 MG capsule Take 1 capsule (5 mg total) by mouth daily as needed.   predniSONE (DELTASONE) 10 MG tablet Use per dose pack   [DISCONTINUED] escitalopram (LEXAPRO) 10 MG tablet Take 1 tablet (10 mg total) by mouth daily.   [DISCONTINUED] estradiol (ESTRACE) 0.1 MG/GM vaginal cream Insert 1 Gm twice a week   [DISCONTINUED] Fluocinolone Acetonide Body 0.01 % OIL apply to affected areas twice  a day prn.  Avoid applying to face, groin, and axilla. Use as directed. Risk of skin atrophy with long-term use reviewed.   [DISCONTINUED] predniSONE (DELTASONE) 10 MG tablet Use per dose pack   [DISCONTINUED] aluminum chloride (DRYSOL) 20 % external solution Apply topically at bedtime. (Patient not taking: Reported on 09/27/2021)   [DISCONTINUED] cyclobenzaprine (FLEXERIL) 10 MG tablet Take 1 tablet (10 mg total) by mouth 2 (two) times daily as needed for muscle spasms. (Patient not taking: Reported on 09/27/2021)   [EXPIRED] methylPREDNISolone acetate (DEPO-MEDROL) injection 80 mg    No facility-administered encounter medications on file as of 09/27/2021.      Medical History: Past Medical History:  Diagnosis Date   Acid reflux    Allergy    used to get allergy shots with ENT last in 2015   Endometriosis    Environmental allergies    Family history of breast cancer 10/2017   cancer genetic testing letter sent   History of abnormal cervical Pap smear 10/14/2011   LGSIL   HLD (hyperlipidemia)    Nipple discharge in female 01/03/2013     Vital Signs: BP 106/65    Pulse 80    Temp 98.1 F (36.7 C)    Resp 16    Ht 5\' 6"  (1.676 m)    Wt 183 lb (83 kg)    LMP 06/11/2016 (Approximate)    SpO2 99%    BMI 29.54 kg/m  Review of Systems  Constitutional:  Negative for fatigue and fever.  HENT:  Negative for congestion, mouth sores and postnasal drip.   Respiratory:  Negative for cough.   Cardiovascular:  Negative for chest pain.  Genitourinary:  Negative for flank pain.  Musculoskeletal:  Positive for back pain and gait problem.       Severe back pain worse on the left  Psychiatric/Behavioral: Negative.     Physical Exam Constitutional:      General: She is not in acute distress.    Appearance: She is well-developed. She is not diaphoretic.  HENT:     Head: Normocephalic and atraumatic.     Mouth/Throat:     Pharynx: No oropharyngeal exudate.  Eyes:     Pupils: Pupils are  equal, round, and reactive to light.  Neck:     Thyroid: No thyromegaly.     Vascular: No JVD.     Trachea: No tracheal deviation.  Cardiovascular:     Rate and Rhythm: Normal rate and regular rhythm.     Heart sounds: Normal heart sounds. No murmur heard.   No friction rub. No gallop.  Pulmonary:     Effort: Pulmonary effort is normal. No respiratory distress.     Breath sounds: No wheezing or rales.  Chest:     Chest wall: No tenderness.  Abdominal:     General: Bowel sounds are normal.     Palpations: Abdomen is soft.  Musculoskeletal:        General: Tenderness and signs of injury present.     Cervical back: Normal range of motion and neck supple.     Comments: Tenderness on palpation of left low back.  Positive straight leg raise.  Patient unable to complete full active range of motion due to pain, passive range of motion also limited due to pain.  Sensation of the lower extremities currently intact  Lymphadenopathy:     Cervical: No cervical adenopathy.  Skin:    General: Skin is warm and dry.  Neurological:     Mental Status: She is alert and oriented to person, place, and time.     Cranial Nerves: No cranial nerve deficit.  Psychiatric:        Behavior: Behavior normal.        Thought Content: Thought content normal.        Judgment: Judgment normal.      Assessment/Plan: 1. Acute left-sided low back pain without sciatica Given Depo-Medrol injection in office to help with decreasing pain and inflammation.  Will go to outpatient imaging center for lumbar x-rays.  Patient will also be sent a prednisone taper as well as muscle relaxer and a few tablets of oxycodone to use as needed.  Discussed patient should not drive or operate any machinery as these medications can make her drowsy.  Patient will keep office up-to-date on how she is doing and will go to [facility] if not improving but also not worsening.  If any acute worsening or symptoms such as saddle paresthesias or  incontinence will go to ED. - predniSONE (DELTASONE) 10 MG tablet; Use per dose pack  Dispense: 21 tablet; Refill: 0 - oxycodone (OXY-IR) 5 MG capsule; Take 1 capsule (5 mg total) by mouth daily as needed.  Dispense: 5 capsule; Refill: 0 - cyclobenzaprine (FLEXERIL) 10 MG tablet; Take 1 tablet (10 mg total) by mouth at bedtime. Take one tab po qhs for back spasm prn only  Dispense: 30 tablet; Refill: 1 - methylPREDNISolone acetate (  DEPO-MEDROL) injection 80 mg - DG Lumbar Spine Complete; Future    General Counseling: Roxi verbalizes understanding of the findings of todays visit and agrees with plan of treatment. I have discussed any further diagnostic evaluation that may be needed or ordered today. We also reviewed her medications today. she has been encouraged to call the office with any questions or concerns that should arise related to todays visit.    Counseling:    Orders Placed This Encounter  Procedures   DG Lumbar Spine Complete    Meds ordered this encounter  Medications   predniSONE (DELTASONE) 10 MG tablet    Sig: Use per dose pack    Dispense:  21 tablet    Refill:  0   oxycodone (OXY-IR) 5 MG capsule    Sig: Take 1 capsule (5 mg total) by mouth daily as needed.    Dispense:  5 capsule    Refill:  0   cyclobenzaprine (FLEXERIL) 10 MG tablet    Sig: Take 1 tablet (10 mg total) by mouth at bedtime. Take one tab po qhs for back spasm prn only    Dispense:  30 tablet    Refill:  1   methylPREDNISolone acetate (DEPO-MEDROL) injection 80 mg    Time spent:30 Minutes

## 2021-09-30 ENCOUNTER — Telehealth: Payer: Self-pay

## 2021-09-30 NOTE — Telephone Encounter (Signed)
Left vm and sent mychart message to confirm 10/05/21 appointment-Toni ?

## 2021-10-04 ENCOUNTER — Telehealth: Payer: Self-pay

## 2021-10-04 NOTE — Telephone Encounter (Signed)
-----   Message from Carlean Jews, PA-C sent at 09/29/2021 11:42 AM EST ----- ?Please let her know that her xray did not show any fractures or dislocations. She does have some disc space narrowing in low back at L5-S1. It her symptoms continue to persist without improvement she should follow up with [facility] as discussed. ?

## 2021-10-04 NOTE — Telephone Encounter (Signed)
Spoke with patient about x-ray results on 10/04/2021. ?

## 2021-10-05 ENCOUNTER — Ambulatory Visit (INDEPENDENT_AMBULATORY_CARE_PROVIDER_SITE_OTHER): Payer: BC Managed Care – PPO | Admitting: Nurse Practitioner

## 2021-10-05 ENCOUNTER — Encounter: Payer: Self-pay | Admitting: Nurse Practitioner

## 2021-10-05 ENCOUNTER — Telehealth: Payer: Self-pay | Admitting: Nurse Practitioner

## 2021-10-05 ENCOUNTER — Other Ambulatory Visit: Payer: Self-pay

## 2021-10-05 VITALS — BP 116/67 | HR 97 | Temp 98.3°F | Resp 16 | Ht 67.0 in | Wt 186.6 lb

## 2021-10-05 DIAGNOSIS — Z5321 Procedure and treatment not carried out due to patient leaving prior to being seen by health care provider: Secondary | ICD-10-CM | POA: Diagnosis not present

## 2021-10-07 NOTE — Progress Notes (Signed)
Patient was checked in at 2:49 PM. Her vitals were done at 2:53 PM. She walked out of the exam room at 3:32 PM without being seen by the provider and stated that she had been waiting too long.  ?

## 2021-10-18 DIAGNOSIS — S0502XA Injury of conjunctiva and corneal abrasion without foreign body, left eye, initial encounter: Secondary | ICD-10-CM | POA: Diagnosis not present

## 2021-10-18 DIAGNOSIS — H11432 Conjunctival hyperemia, left eye: Secondary | ICD-10-CM | POA: Diagnosis not present

## 2021-10-20 NOTE — Telephone Encounter (Signed)
Patient walked out from appointment. Stated she had waited over 45 minutes for her appointment. I asked her if she wanted to reschedule, she stated no, she would not be coming back.-Toni.  ?

## 2021-11-24 ENCOUNTER — Other Ambulatory Visit: Payer: Self-pay | Admitting: Physician Assistant

## 2021-11-24 DIAGNOSIS — M545 Low back pain, unspecified: Secondary | ICD-10-CM

## 2021-12-29 ENCOUNTER — Other Ambulatory Visit: Payer: Self-pay | Admitting: Physician Assistant

## 2021-12-29 DIAGNOSIS — M545 Low back pain, unspecified: Secondary | ICD-10-CM

## 2022-01-17 DIAGNOSIS — Z20822 Contact with and (suspected) exposure to covid-19: Secondary | ICD-10-CM | POA: Diagnosis not present

## 2022-05-26 DIAGNOSIS — M25562 Pain in left knee: Secondary | ICD-10-CM | POA: Diagnosis not present

## 2022-05-30 ENCOUNTER — Encounter (INDEPENDENT_AMBULATORY_CARE_PROVIDER_SITE_OTHER): Payer: Self-pay

## 2022-07-22 DIAGNOSIS — H16041 Marginal corneal ulcer, right eye: Secondary | ICD-10-CM | POA: Diagnosis not present

## 2022-08-10 ENCOUNTER — Ambulatory Visit (INDEPENDENT_AMBULATORY_CARE_PROVIDER_SITE_OTHER): Payer: PRIVATE HEALTH INSURANCE | Admitting: Dermatology

## 2022-08-10 DIAGNOSIS — L74519 Primary focal hyperhidrosis, unspecified: Secondary | ICD-10-CM

## 2022-08-10 DIAGNOSIS — L821 Other seborrheic keratosis: Secondary | ICD-10-CM | POA: Diagnosis not present

## 2022-08-10 DIAGNOSIS — L219 Seborrheic dermatitis, unspecified: Secondary | ICD-10-CM | POA: Diagnosis not present

## 2022-08-10 DIAGNOSIS — L74510 Primary focal hyperhidrosis, axilla: Secondary | ICD-10-CM | POA: Diagnosis not present

## 2022-08-10 MED ORDER — ALUMINUM CHLORIDE 20 % EX SOLN: Freq: Every day | CUTANEOUS | 11 refills | Status: DC

## 2022-08-10 MED ORDER — KETOCONAZOLE 2 % EX SHAM: MEDICATED_SHAMPOO | CUTANEOUS | 11 refills | Status: DC

## 2022-08-10 MED ORDER — FLUOCINOLONE ACETONIDE BODY 0.01 % EX OIL: TOPICAL_OIL | CUTANEOUS | 11 refills | Status: DC

## 2022-08-10 NOTE — Patient Instructions (Signed)
Due to recent changes in healthcare laws, you may see results of your pathology and/or laboratory studies on MyChart before the doctors have had a chance to review them. We understand that in some cases there may be results that are confusing or concerning to you. Please understand that not all results are received at the same time and often the doctors may need to interpret multiple results in order to provide you with the best plan of care or course of treatment. Therefore, we ask that you please give us 2 business days to thoroughly review all your results before contacting the office for clarification. Should we see a critical lab result, you will be contacted sooner.   If You Need Anything After Your Visit  If you have any questions or concerns for your doctor, please call our main line at [phone] and press option 4 to reach your doctor's medical assistant. If no one answers, please leave a voicemail as directed and we will return your call as soon as possible. Messages left after 4 pm will be answered the following business day.   You may also send us a message via MyChart. We typically respond to MyChart messages within 1-2 business days.  For prescription refills, please ask your pharmacy to contact our office. Our fax number is [phone].  If you have an urgent issue when the clinic is closed that cannot wait until the next business day, you can page your doctor at the number below.    Please note that while we do our best to be available for urgent issues outside of office hours, we are not available 24/7.   If you have an urgent issue and are unable to reach us, you may choose to seek medical care at your doctor's office, retail clinic, urgent care center, or emergency room.  If you have a medical emergency, please immediately call 911 or go to the emergency department.  Pager Numbers  - Dr. Kowalski: [phone]  - Dr. Moye: [phone]  - Dr. Stewart:  [phone]  In the event of inclement weather, please call our main line at [phone] for an update on the status of any delays or closures.  Dermatology Medication Tips: Please keep the boxes that topical medications come in in order to help keep track of the instructions about where and how to use these. Pharmacies typically print the medication instructions only on the boxes and not directly on the medication tubes.   If your medication is too expensive, please contact our office at [phone] option 4 or send us a message through MyChart.   We are unable to tell what your co-pay for medications will be in advance as this is different depending on your insurance coverage. However, we may be able to find a substitute medication at lower cost or fill out paperwork to get insurance to cover a needed medication.   If a prior authorization is required to get your medication covered by your insurance company, please allow us 1-2 business days to complete this process.  Drug prices often vary depending on where the prescription is filled and some pharmacies may offer cheaper prices.  The website www.goodrx.com contains coupons for medications through different pharmacies. The prices here do not account for what the cost may be with help from insurance (it may be cheaper with your insurance), but the website can give you the price if you did not use any insurance.  - You can print the associated coupon and take it with   your prescription to the pharmacy.  - You may also stop by our office during regular business hours and pick up a GoodRx coupon card.  - If you need your prescription sent electronically to a different pharmacy, notify our office through [facility] MyChart or by phone at [phone] option 4.     Si Usted Necesita Algo Despus de Su Visita  Tambin puede enviarnos un mensaje a travs de MyChart. Por lo general respondemos a los mensajes de MyChart en el transcurso de 1 a 2  das hbiles.  Para renovar recetas, por favor pida a su farmacia que se ponga en contacto con nuestra oficina. Nuestro nmero de fax es el [phone].  Si tiene un asunto urgente cuando la clnica est cerrada y que no puede esperar hasta el siguiente da hbil, puede llamar/localizar a su doctor(a) al nmero que aparece a continuacin.   Por favor, tenga en cuenta que aunque hacemos todo lo posible para estar disponibles para asuntos urgentes fuera del horario de oficina, no estamos disponibles las 24 horas del da, los 7 das de la semana.   Si tiene un problema urgente y no puede comunicarse con nosotros, puede optar por buscar atencin mdica  en el consultorio de su doctor(a), en una clnica privada, en un centro de atencin urgente o en una sala de emergencias.  Si tiene una emergencia mdica, por favor llame inmediatamente al 911 o vaya a la sala de emergencias.  Nmeros de bper  - Dr. Kowalski: [phone]  - Dra. Moye: [phone]  - Dra. Stewart: [phone]  En caso de inclemencias del tiempo, por favor llame a nuestra lnea principal al [phone] para una actualizacin sobre el estado de cualquier retraso o cierre.  Consejos para la medicacin en dermatologa: Por favor, guarde las cajas en las que vienen los medicamentos de uso tpico para ayudarle a seguir las instrucciones sobre dnde y cmo usarlos. Las farmacias generalmente imprimen las instrucciones del medicamento slo en las cajas y no directamente en los tubos del medicamento.   Si su medicamento es muy caro, por favor, pngase en contacto con nuestra oficina llamando al [phone] y presione la opcin 4 o envenos un mensaje a travs de MyChart.   No podemos decirle cul ser su copago por los medicamentos por adelantado ya que esto es diferente dependiendo de la cobertura de su seguro. Sin embargo, es posible que podamos encontrar un medicamento sustituto a menor costo o llenar un formulario para que el  seguro cubra el medicamento que se considera necesario.   Si se requiere una autorizacin previa para que su compaa de seguros cubra su medicamento, por favor permtanos de 1 a 2 das hbiles para completar este proceso.  Los precios de los medicamentos varan con frecuencia dependiendo del lugar de dnde se surte la receta y alguna farmacias pueden ofrecer precios ms baratos.  El sitio web www.goodrx.com tiene cupones para medicamentos de diferentes farmacias. Los precios aqu no tienen en cuenta lo que podra costar con la ayuda del seguro (puede ser ms barato con su seguro), pero el sitio web puede darle el precio si no utiliz ningn seguro.  - Puede imprimir el cupn correspondiente y llevarlo con su receta a la farmacia.  - Tambin puede pasar por nuestra oficina durante el horario de atencin regular y recoger una tarjeta de cupones de GoodRx.  - Si necesita que su receta se enve electrnicamente a una farmacia diferente, informe a nuestra oficina a travs de MyChart de [facility]   o por telfono llamando al [phone] y presione la opcin 4.  

## 2022-08-10 NOTE — Progress Notes (Signed)
Follow-Up Visit   Subjective  DANEY MOOR is a 47 y.o. female who presents for the following: Follow-up.  Patient presents for follow-up seborrheic dermatitis of the scalp. She needs refills of ketoconazole 2% shampoo and Derma-Smoothe oil. She also has hyperhidrosis of the axilla and needs refills of Drysol. Uses twice weekly with good results.  More often usage gives her a rash.  The following portions of the chart were reviewed this encounter and updated as appropriate:       Review of Systems:  No other skin or systemic complaints except as noted in HPI or Assessment and Plan.  Objective  Well appearing patient in no apparent distress; mood and affect are within normal limits.  A focused examination was performed including face, scalp. Relevant physical exam findings are noted in the Assessment and Plan.  Scalp Pink patches with greasy scale occipital scalp.   bil axilla Axilla with moisture    Assessment & Plan  Seborrheic dermatitis Scalp  Chronic and persistent condition with duration or expected duration over one year. Condition is bothersome/symptomatic for patient. Currently flared since out of Rx medications.   Seborrheic Dermatitis  -  is a chronic persistent rash characterized by pinkness and scaling most commonly of the mid face but also can occur on the scalp (dandruff), ears; mid chest, mid back and groin.  It tends to be exacerbated by stress and cooler weather.  People who have neurologic disease may experience new onset or exacerbation of existing seborrheic dermatitis.  The condition is not curable but treatable and can be controlled.  Cont Ketoconazole 2% shampoo apply to scalp once a week as directed prn 1 yr Rf. Cont Fluocinolone scalp oil apply to scalp twice a day prn itch,irritation. Use as directed. 1 yr Rf.  Patient will call for topical steroid solution if flared and not improving.   Topical steroids (such as triamcinolone, fluocinolone,  fluocinonide, mometasone, clobetasol, halobetasol, betamethasone, hydrocortisone) can cause thinning and lightening of the skin if they are used for too long in the same area. Your physician has selected the right strength medicine for your problem and area affected on the body. Please use your medication only as directed by your physician to prevent side effects.    ketoconazole (NIZORAL) 2 % shampoo - Scalp Massage into scalp, let sit several minutes before rinsing.  Fluocinolone Acetonide Body 0.01 % OIL - Scalp Apply to scalp and leave on once to twice daily as needed.  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 Condition is symptomatic / bothersome to patient. Not to goal since out of Drysol.  Needs rfs  Cont Drysol apply to clean dry axiilae at bedtime as tolerated. Pt uses twice a week.  1 yr Rf. May cont wearing otc Deodorant on alternate days  aluminum chloride (DRYSOL) 20 % external solution - bil axilla Apply topically at bedtime.  Seborrheic Keratoses (DPN) - Stuck-on, waxy, tan-brown papules cheeks  - Benign-appearing - Discussed benign etiology and prognosis. - Discussed cosmetic removal. Discussed cosmetic procedure, noncovered.  $60 for 1st lesion and $15 for each additional lesion if done on the same day.  Maximum charge $350.  One touch-up treatment included no charge. Discussed risks of treatment including dyspigmentation, small scar, and/or recurrence. Recommend daily broad spectrum sunscreen SPF 30+/photoprotection to treated areas once healed.  - Observe - Call for any changes  Return for cosmetic SK removal (ED) - face.  IJamesetta Orleans, CMA, am acting as scribe for Brendolyn Patty, MD .  Documentation: I have reviewed the above documentation for accuracy and completeness, and I agree with the above.  Brendolyn Patty MD

## 2022-09-27 ENCOUNTER — Encounter: Payer: BC Managed Care – PPO | Admitting: Nurse Practitioner

## 2022-10-21 ENCOUNTER — Encounter: Payer: Self-pay | Admitting: Obstetrics and Gynecology

## 2022-10-21 ENCOUNTER — Ambulatory Visit (INDEPENDENT_AMBULATORY_CARE_PROVIDER_SITE_OTHER): Payer: PRIVATE HEALTH INSURANCE | Admitting: Obstetrics and Gynecology

## 2022-10-21 VITALS — BP 109/73 | HR 88 | Ht 67.0 in | Wt 172.2 lb

## 2022-10-21 DIAGNOSIS — Z01419 Encounter for gynecological examination (general) (routine) without abnormal findings: Secondary | ICD-10-CM | POA: Diagnosis not present

## 2022-10-21 DIAGNOSIS — Z1231 Encounter for screening mammogram for malignant neoplasm of breast: Secondary | ICD-10-CM

## 2022-10-21 DIAGNOSIS — I83812 Varicose veins of left lower extremities with pain: Secondary | ICD-10-CM

## 2022-10-21 DIAGNOSIS — N951 Menopausal and female climacteric states: Secondary | ICD-10-CM

## 2022-10-21 NOTE — Progress Notes (Signed)
Patients presents for annual exam today. She states menopausal symptoms such as trouble sleeping, hot flashes, night sweats and trouble concentrating. Patient is due for mammogram, ordered. Annual labs are ordered, will return when fasting.  She states no other questions or concerns at this time.

## 2022-10-21 NOTE — Progress Notes (Signed)
HPI:      [name] is a 47 y.o. [id number] who LMP was Patient's last menstrual period was 06/11/2016 (approximate).  Subjective:   She presents today for her annual examination.  She is generally doing well.  She does complain of hot flashes 2-3 times per week.  This is new for her.  She also complains of left lower leg varicosities which are painful for her. This year she is denying significant issues with incontinence. Of significant note, she has had a previous hysterectomy for endometriosis.    Hx: The following portions of the patient's history were reviewed and updated as appropriate:             She  has a past medical history of Acid reflux, Allergy, Endometriosis, Environmental allergies, Family history of breast cancer (10/2017), History of abnormal cervical Pap smear (10/14/2011), HLD (hyperlipidemia), and Nipple discharge in female (01/03/2013). She does not have any pertinent problems on file. She  has a past surgical history that includes Cesarean section; Breast surgery; Cesarean section (2005); Breast surgery (Left, 11/14/14); Laparoscopic supracervical hysterectomy (07/16/2009); Bilateral salpingoophorectomy (2010); and Colposcopy (2013). Her family history includes Aneurysm in her father; Breast cancer (age of onset: 39) in her paternal aunt; Diabetes in her father, maternal aunt, maternal aunt, maternal uncle, paternal grandfather, and paternal grandmother; Hyperlipidemia in her father; Hypertension in her father, maternal aunt, maternal grandfather, and maternal uncle; Sickle cell anemia in her maternal aunt; Stroke in her father. She  reports that she has never smoked. She has never used smokeless tobacco. She reports current alcohol use. She reports that she does not use drugs. She has a current medication list which includes the following prescription(s): aluminum chloride, fluocinolone acetonide body, ketoconazole, cyclobenzaprine, and oxycodone. She is allergic to  sulfa antibiotics, latex, penicillins, penicillins, shellfish allergy, shellfish allergy, sulfa antibiotics, sulfasalazine, and latex.       Review of Systems:  Review of Systems  Constitutional: Denied constitutional symptoms, night sweats, recent illness, fatigue, fever, insomnia and weight loss.  Eyes: Denied eye symptoms, eye pain, photophobia, vision change and visual disturbance.  Ears/Nose/Throat/Neck: Denied ear, nose, throat or neck symptoms, hearing loss, nasal discharge, sinus congestion and sore throat.  Cardiovascular: Denied cardiovascular symptoms, arrhythmia, chest pain/pressure, edema, exercise intolerance, orthopnea and palpitations.  Respiratory: Denied pulmonary symptoms, asthma, pleuritic pain, productive sputum, cough, dyspnea and wheezing.  Gastrointestinal: Denied, gastro-esophageal reflux, melena, nausea and vomiting.  Genitourinary: Denied genitourinary symptoms including symptomatic vaginal discharge, pelvic relaxation issues, and urinary complaints.  Musculoskeletal: See HPI for additional information.  Dermatologic: Denied dermatology symptoms, rash and scar.  Neurologic: Denied neurology symptoms, dizziness, headache, neck pain and syncope.  Psychiatric: Denied psychiatric symptoms, anxiety and depression.  Endocrine: Denied endocrine symptoms including hot flashes and night sweats.   Meds:   Current Outpatient Medications on File Prior to Visit  Medication Sig Dispense Refill   aluminum chloride (DRYSOL) 20 % external solution Apply topically at bedtime. 35 mL 11   Fluocinolone Acetonide Body 0.01 % OIL Apply to scalp and leave on once to twice daily as needed. 120 mL 11   ketoconazole (NIZORAL) 2 % shampoo Massage into scalp, let sit several minutes before rinsing. 120 mL 11   cyclobenzaprine (FLEXERIL) 10 MG tablet Take 1 tablet (10 mg total) by mouth at bedtime. Take one tab po qhs for back spasm prn only (Patient not taking: Reported on 10/21/2022) 30 tablet  1   oxycodone (OXY-IR) 5 MG capsule Take 1 capsule (  5 mg total) by mouth daily as needed. (Patient not taking: Reported on 10/21/2022) 5 capsule 0   No current facility-administered medications on file prior to visit.     Objective:     Vitals:   10/21/22 0825  BP: 109/73  Pulse: 88    Filed Weights   10/21/22 0825  Weight: 172 lb 3.2 oz (78.1 kg)              Physical examination General NAD, Conversant  HEENT Atraumatic; Op clear with mmm.  Normo-cephalic. Pupils reactive. Anicteric sclerae  Thyroid/Neck Smooth without nodularity or enlargement. Normal ROM.  Neck Supple.  Skin No rashes, lesions or ulceration. Normal palpated skin turgor. No nodularity.  Breasts: No masses or discharge.  Symmetric.  No axillary adenopathy.  Lungs: Clear to auscultation.No rales or wheezes. Normal Respiratory effort, no retractions.  Heart: NSR.  No murmurs or rubs appreciated. No peripheral edema  Abdomen: Soft.  Non-tender.  No masses.  No HSM. No hernia  Extremities: Moves all appropriately.  Normal ROM for age. No lymphadenopathy.  Varicosities present in left lower extremity  Neuro: Oriented to PPT.  Normal mood. Normal affect.     Pelvic:   Vulva: Normal appearance.  No lesions.  Vagina: No lesions or abnormalities noted.  Support: Normal pelvic support.  Urethra No masses tenderness or scarring.  Meatus Normal size without lesions or prolapse.  Cervix: Surgically absent  Anus: Normal exam.  No lesions.  Perineum: Normal exam.  No lesions.        Bimanual   Uterus: Surgically absent  Adnexae: No masses.  Non-tender to palpation.  Cul-de-sac: Negative for abnormality.     Assessment:    [id number] Patient Active Problem List   Diagnosis Date Noted   Annual physical exam 02/13/2019   HLD (hyperlipidemia)    Surgical menopause, symptomatic 10/07/2017   History of abnormal cervical Pap smear 10/07/2017   Dyspareunia in female 10/07/2017   Nipple discharge in female 01/03/2013      1. Well woman exam   2. Screening mammogram for breast cancer   3. Menopausal symptoms   4. Varicose veins of left lower extremity with pain        Plan:            1.  Basic Screening Recommendations The basic screening recommendations for asymptomatic women were discussed with the patient during her visit.  The age-appropriate recommendations were discussed with her and the rational for the tests reviewed.  When I am informed by the patient that another primary care physician has previously obtained the age-appropriate tests and they are up-to-date, only outstanding tests are ordered and referrals given as necessary.  Abnormal results of tests will be discussed with her when all of her results are completed.  Routine preventative health maintenance measures emphasized: Exercise/Diet/Weight control, Tobacco Warnings, Alcohol/Substance use risks and Stress Management Mammogram ordered-blood work ordered 2.  Num[place] added to blood work 3.  Referral to vascular for varicosities  Orders Orders Placed This Encounter  Procedures   MM DIGITAL SCREENING BILATERAL   Basic metabolic panel   CBC   TSH   Lipid panel   Hemoglobin A1c   [place]   Ambulatory referral to Vascular Surgery    No orders of the defined types were placed in this encounter.         F/U  Return in about 1 year (around 10/21/2023) for Annual Physical.  Finis Bud, M.D. 10/21/2022 8:42 AM

## 2022-10-27 ENCOUNTER — Other Ambulatory Visit: Payer: PRIVATE HEALTH INSURANCE

## 2022-10-27 DIAGNOSIS — Z01419 Encounter for gynecological examination (general) (routine) without abnormal findings: Secondary | ICD-10-CM

## 2022-10-27 DIAGNOSIS — N951 Menopausal and female climacteric states: Secondary | ICD-10-CM

## 2022-10-28 LAB — BASIC METABOLIC PANEL
BUN/Creatinine Ratio: 11 (ref 9–23)
BUN: 8 mg/dL (ref 6–24)
CO2: 22 mmol/L (ref 20–29)
Calcium: 9.6 mg/dL (ref 8.7–10.2)
Chloride: 103 mmol/L (ref 96–106)
Creatinine, Ser: 0.72 mg/dL (ref 0.57–1.00)
Glucose: 99 mg/dL (ref 70–99)
Potassium: 4.1 mmol/L (ref 3.5–5.2)
Sodium: 143 mmol/L (ref 134–144)
eGFR: 105 mL/min/{1.73_m2} (ref >59)

## 2022-10-28 LAB — LIPID PANEL
Chol/HDL Ratio: 3.4 ratio (ref 0.0–4.4)
Cholesterol, Total: 231 mg/dL — ABNORMAL HIGH (ref 100–199)
HDL: 68 mg/dL (ref >39)
LDL Chol Calc (NIH): 149 mg/dL — ABNORMAL HIGH (ref 0–99)
Triglycerides: 82 mg/dL (ref 0–149)
VLDL Cholesterol Cal: 14 mg/dL (ref 5–40)

## 2022-10-28 LAB — HEMOGLOBIN A1C
Est. average glucose Bld gHb Est-mCnc: 131 mg/dL
Hgb A1c MFr Bld: 6.2 % — ABNORMAL HIGH (ref 4.8–5.6)

## 2022-10-28 LAB — CBC
Hematocrit: 39.6 % (ref 34.0–46.6)
Hemoglobin: 12.9 g/dL (ref 11.1–15.9)
MCH: 28.8 pg (ref 26.6–33.0)
MCHC: 32.6 g/dL (ref 31.5–35.7)
MCV: 88 fL (ref 79–97)
Platelets: 266 10*3/uL (ref 150–450)
RBC: 4.48 x10E6/uL (ref 3.77–5.28)
RDW: 14 % (ref 11.7–15.4)
WBC: 2.8 10*3/uL — ABNORMAL LOW (ref 3.4–10.8)

## 2022-10-28 LAB — FOLLICLE STIMULATING HORMONE: FSH: 73.3 m[IU]/mL

## 2022-10-28 LAB — TSH: TSH: 0.781 u[IU]/mL (ref 0.450–4.500)

## 2022-11-21 ENCOUNTER — Ambulatory Visit: Payer: PRIVATE HEALTH INSURANCE | Admitting: Dermatology

## 2022-11-22 ENCOUNTER — Ambulatory Visit
Admission: RE | Admit: 2022-11-22 | Discharge: 2022-11-22 | Disposition: A | Discharge status: Home / Self Care | Payer: Managed Care, Other (non HMO) | Source: Ambulatory Visit | Attending: Obstetrics and Gynecology | Admitting: Obstetrics and Gynecology

## 2022-11-22 DIAGNOSIS — Z01419 Encounter for gynecological examination (general) (routine) without abnormal findings: Secondary | ICD-10-CM | POA: Insufficient documentation

## 2022-11-22 DIAGNOSIS — Z1231 Encounter for screening mammogram for malignant neoplasm of breast: Secondary | ICD-10-CM | POA: Insufficient documentation

## 2022-11-23 ENCOUNTER — Inpatient Hospital Stay
Admission: RE | Admit: 2022-11-23 | Discharge: 2022-11-23 | Disposition: A | Discharge status: Home / Self Care | Payer: Self-pay | Source: Ambulatory Visit | Attending: Adult Health | Admitting: Adult Health

## 2022-11-23 ENCOUNTER — Other Ambulatory Visit: Payer: Self-pay | Admitting: *Deleted

## 2022-11-23 DIAGNOSIS — Z1231 Encounter for screening mammogram for malignant neoplasm of breast: Secondary | ICD-10-CM

## 2022-11-30 NOTE — Telephone Encounter (Signed)
error 

## 2022-12-16 ENCOUNTER — Other Ambulatory Visit (INDEPENDENT_AMBULATORY_CARE_PROVIDER_SITE_OTHER): Payer: Self-pay | Admitting: Nurse Practitioner

## 2022-12-16 DIAGNOSIS — I83812 Varicose veins of left lower extremities with pain: Secondary | ICD-10-CM

## 2022-12-21 ENCOUNTER — Encounter (INDEPENDENT_AMBULATORY_CARE_PROVIDER_SITE_OTHER): Payer: BC Managed Care – PPO

## 2022-12-21 ENCOUNTER — Encounter (INDEPENDENT_AMBULATORY_CARE_PROVIDER_SITE_OTHER): Payer: BC Managed Care – PPO | Admitting: Nurse Practitioner

## 2022-12-27 ENCOUNTER — Ambulatory Visit (INDEPENDENT_AMBULATORY_CARE_PROVIDER_SITE_OTHER): Payer: PRIVATE HEALTH INSURANCE | Admitting: Dermatology

## 2022-12-27 VITALS — BP 110/72

## 2022-12-27 DIAGNOSIS — L821 Other seborrheic keratosis: Secondary | ICD-10-CM | POA: Diagnosis not present

## 2022-12-27 DIAGNOSIS — L820 Inflamed seborrheic keratosis: Secondary | ICD-10-CM

## 2022-12-27 NOTE — Patient Instructions (Addendum)
Due to recent changes in healthcare laws, you may see results of your pathology and/or laboratory studies on MyChart before the doctors have had a chance to review them. We understand that in some cases there may be results that are confusing or concerning to you. Please understand that not all results are received at the same time and often the doctors may need to interpret multiple results in order to provide you with the best plan of care or course of treatment. Therefore, we ask that you please give us 2 business days to thoroughly review all your results before contacting the office for clarification. Should we see a critical lab result, you will be contacted sooner.   If You Need Anything After Your Visit  If you have any questions or concerns for your doctor, please call our main line at [phone] and press option 4 to reach your doctor's medical assistant. If no one answers, please leave a voicemail as directed and we will return your call as soon as possible. Messages left after 4 pm will be answered the following business day.   You may also send us a message via MyChart. We typically respond to MyChart messages within 1-2 business days.  For prescription refills, please ask your pharmacy to contact our office. Our fax number is [phone].  If you have an urgent issue when the clinic is closed that cannot wait until the next business day, you can page your doctor at the number below.    Please note that while we do our best to be available for urgent issues outside of office hours, we are not available 24/7.   If you have an urgent issue and are unable to reach us, you may choose to seek medical care at your doctor's office, retail clinic, urgent care center, or emergency room.  If you have a medical emergency, please immediately call 911 or go to the emergency department.  Pager Numbers  - Dr. Kowalski: [phone]  - Dr. Moye: [phone]  - Dr. Stewart:  [phone]  In the event of inclement weather, please call our main line at [phone] for an update on the status of any delays or closures.  Dermatology Medication Tips: Please keep the boxes that topical medications come in in order to help keep track of the instructions about where and how to use these. Pharmacies typically print the medication instructions only on the boxes and not directly on the medication tubes.   If your medication is too expensive, please contact our office at [phone] option 4 or send us a message through MyChart.   We are unable to tell what your co-pay for medications will be in advance as this is different depending on your insurance coverage. However, we may be able to find a substitute medication at lower cost or fill out paperwork to get insurance to cover a needed medication.   If a prior authorization is required to get your medication covered by your insurance company, please allow us 1-2 business days to complete this process.  Drug prices often vary depending on where the prescription is filled and some pharmacies may offer cheaper prices.  The website www.goodrx.com contains coupons for medications through different pharmacies. The prices here do not account for what the cost may be with help from insurance (it may be cheaper with your insurance), but the website can give you the price if you did not use any insurance.  - You can print the associated coupon and take it with   your prescription to the pharmacy.  - You may also stop by our office during regular business hours and pick up a GoodRx coupon card.  - If you need your prescription sent electronically to a different pharmacy, notify our office through [facility] MyChart or by phone at [phone] option 4.     Si Usted Necesita Algo Despus de Su Visita  Tambin puede enviarnos un mensaje a travs de MyChart. Por lo general respondemos a los mensajes de MyChart en el transcurso de 1 a 2  das hbiles.  Para renovar recetas, por favor pida a su farmacia que se ponga en contacto con nuestra oficina. Nuestro nmero de fax es el [phone].  Si tiene un asunto urgente cuando la clnica est cerrada y que no puede esperar hasta el siguiente da hbil, puede llamar/localizar a su doctor(a) al nmero que aparece a continuacin.   Por favor, tenga en cuenta que aunque hacemos todo lo posible para estar disponibles para asuntos urgentes fuera del horario de oficina, no estamos disponibles las 24 horas del da, los 7 das de la semana.   Si tiene un problema urgente y no puede comunicarse con nosotros, puede optar por buscar atencin mdica  en el consultorio de su doctor(a), en una clnica privada, en un centro de atencin urgente o en una sala de emergencias.  Si tiene una emergencia mdica, por favor llame inmediatamente al 911 o vaya a la sala de emergencias.  Nmeros de bper  - Dr. Kowalski: [phone]  - Dra. Moye: [phone]  - Dra. Stewart: [phone]  En caso de inclemencias del tiempo, por favor llame a nuestra lnea principal al [phone] para una actualizacin sobre el estado de cualquier retraso o cierre.  Consejos para la medicacin en dermatologa: Por favor, guarde las cajas en las que vienen los medicamentos de uso tpico para ayudarle a seguir las instrucciones sobre dnde y cmo usarlos. Las farmacias generalmente imprimen las instrucciones del medicamento slo en las cajas y no directamente en los tubos del medicamento.   Si su medicamento es muy caro, por favor, pngase en contacto con nuestra oficina llamando al [phone] y presione la opcin 4 o envenos un mensaje a travs de MyChart.   No podemos decirle cul ser su copago por los medicamentos por adelantado ya que esto es diferente dependiendo de la cobertura de su seguro. Sin embargo, es posible que podamos encontrar un medicamento sustituto a menor costo o llenar un formulario para que el  seguro cubra el medicamento que se considera necesario.   Si se requiere una autorizacin previa para que su compaa de seguros cubra su medicamento, por favor permtanos de 1 a 2 das hbiles para completar este proceso.  Los precios de los medicamentos varan con frecuencia dependiendo del lugar de dnde se surte la receta y alguna farmacias pueden ofrecer precios ms baratos.  El sitio web www.goodrx.com tiene cupones para medicamentos de diferentes farmacias. Los precios aqu no tienen en cuenta lo que podra costar con la ayuda del seguro (puede ser ms barato con su seguro), pero el sitio web puede darle el precio si no utiliz ningn seguro.  - Puede imprimir el cupn correspondiente y llevarlo con su receta a la farmacia.  - Tambin puede pasar por nuestra oficina durante el horario de atencin regular y recoger una tarjeta de cupones de GoodRx.  - Si necesita que su receta se enve electrnicamente a una farmacia diferente, informe a nuestra oficina a travs de MyChart de [place]   o por telfono llamando al [phone] y presione la opcin 4.  

## 2022-12-27 NOTE — Progress Notes (Signed)
   Follow-Up Visit   Subjective  Christine Fry is a 47 y.o. female who presents for the following: cosmetic SK (DPN) to treat face, Irritated Sks on  neck, L axilla, itchy   The following portions of the chart were reviewed this encounter and updated as appropriate: medications, allergies, medical history  Review of Systems:  No other skin or systemic complaints except as noted in HPI or Assessment and Plan.  Objective  Well appearing patient in no apparent distress; mood and affect are within normal limits.    A focused examination was performed of the following areas: Face, neck, left axilla  Relevant exam findings are noted in the Assessment and Plan.  L cheek x 9, R forehead x 1 (10) Stuck on waxy paps   L axilla x 1, L lower neck x 6, R lower neck x 15 (22) Stuck on waxy paps with erythema       Assessment & Plan     Seborrheic keratosis (10) L cheek x 9, R forehead x 1  Discussed cosmetic procedure, noncovered.  $60 for 1st lesion and $15 for each additional lesion if done on the same day.  Maximum charge $350.  One touch-up treatment included no charge. Discussed risks of treatment including dyspigmentation, small scar, and/or recurrence. Recommend daily broad spectrum sunscreen SPF 30+/photoprotection to treated areas once healed.   Destruction of lesion - L cheek x 9, R forehead x 1 Complexity: simple   Destruction method comment:  Electrodesiccation Informed consent: discussed and consent obtained   Timeout:  patient name, date of birth, surgical site, and procedure verified Patient was prepped and draped in usual sterile fashion: patient was prepped with isopropyl alcohol. Outcome: patient tolerated procedure well with no complications    Inflamed seborrheic keratosis (22) L axilla x 1, L lower neck x 6, R lower neck x 15  Symptomatic, irritating, patient would like treated.   Destruction of lesion - L axilla x 1, L lower neck x 6, R lower neck x  15 Complexity: simple   Destruction method comment:  Electrodesiccation Informed consent: discussed and consent obtained   Timeout:  patient name, date of birth, surgical site, and procedure verified Patient was prepped and draped in usual sterile fashion: patient was prepped with isopropyl alcohol. Outcome: patient tolerated procedure well with no complications      Return for 6-8 wks for sk treatment.  I, Ardis Rowan, RMA, am acting as scribe for [name], MD .   Documentation: I have reviewed the above documentation for accuracy and completeness, and I agree with the above.  [name], MD

## 2023-02-03 ENCOUNTER — Encounter (INDEPENDENT_AMBULATORY_CARE_PROVIDER_SITE_OTHER): Payer: BC Managed Care – PPO | Admitting: Vascular Surgery

## 2023-02-03 ENCOUNTER — Encounter (INDEPENDENT_AMBULATORY_CARE_PROVIDER_SITE_OTHER): Payer: BC Managed Care – PPO

## 2023-02-15 ENCOUNTER — Ambulatory Visit (INDEPENDENT_AMBULATORY_CARE_PROVIDER_SITE_OTHER): Payer: Managed Care, Other (non HMO)

## 2023-02-15 ENCOUNTER — Encounter (INDEPENDENT_AMBULATORY_CARE_PROVIDER_SITE_OTHER): Payer: Self-pay | Admitting: Nurse Practitioner

## 2023-02-15 ENCOUNTER — Ambulatory Visit (INDEPENDENT_AMBULATORY_CARE_PROVIDER_SITE_OTHER): Payer: Managed Care, Other (non HMO) | Admitting: Nurse Practitioner

## 2023-02-15 VITALS — BP 115/78 | HR 85 | Resp 18 | Ht 66.0 in | Wt 175.0 lb

## 2023-02-15 DIAGNOSIS — I83812 Varicose veins of left lower extremities with pain: Secondary | ICD-10-CM

## 2023-02-15 NOTE — Progress Notes (Addendum)
Subjective:    Patient ID: Christine Fry, female    DOB: 07/17/1976, 47 y.o.   MRN: [id number] Chief Complaint  Patient presents with   Follow-up    Np consult with reflux Painful varicosities left lower extremity    Christine Fry is a 47 year old female that is seen for evaluation of symptomatic varicose veins. The patient relates burning and stinging which worsened steadily throughout the course of the day, particularly with standing. The patient also notes an aching and throbbing pain over the varicosities, particularly with prolonged dependent positions. The symptoms are significantly improved with elevation.  The patient also notes that during hot weather the symptoms are greatly intensified. The patient states the pain from the varicose veins interferes with work, daily exercise, shopping and household maintenance. At this point, the symptoms are persistent and severe enough that they're having a negative impact on lifestyle and are interfering with daily activities.  There is no history of DVT, PE or superficial thrombophlebitis. There is no history of ulceration or hemorrhage. The patient endorses a significant family history of varicose veins.  The patient has worn graduated compression in the past. At the present time the patient has been using over-the-counter analgesics. There is no history of prior surgical intervention or sclerotherapy.  The patient has no evidence of DVT or superficial phlebitis bilaterally.  No evidence of deep venous insufficiency in the left.  The left great saphenous vein has evidence of significant venous reflux.  No reflux in the small saphenous vein.    Review of Systems  Cardiovascular:  Negative for chest pain.  All other systems reviewed and are negative.      Objective:   Physical Exam Vitals reviewed.  HENT:     Head: Normocephalic.  Cardiovascular:     Rate and Rhythm: Normal rate.     Pulses: Normal pulses.  Pulmonary:      Effort: Pulmonary effort is normal.  Musculoskeletal:        General: Tenderness present.     Left lower leg: Edema present.  Skin:    General: Skin is warm and dry.  Neurological:     Mental Status: She is alert and oriented to person, place, and time.  Psychiatric:        Mood and Affect: Mood normal.        Behavior: Behavior normal.        Thought Content: Thought content normal.        Judgment: Judgment normal.     BP 115/78 (BP Location: Left Arm)   Pulse 85   Resp 18   Ht 5\' 6"  (1.676 m)   Wt 175 lb (79.4 kg)   LMP 06/11/2016 (Approximate)   BMI 28.25 kg/m   Past Medical History:  Diagnosis Date   Acid reflux    Allergy    used to get allergy shots with ENT last in 2015   Endometriosis    Environmental allergies    Family history of breast cancer 10/2017   cancer genetic testing letter sent   History of abnormal cervical Pap smear 10/14/2011   LGSIL   HLD (hyperlipidemia)    Nipple discharge in female 01/03/2013    Social History   Socioeconomic History   Marital status: Significant Other    Spouse name: Not on file   Number of children: 2   Years of education: 14   Highest education level: Not on file  Occupational History   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 [employer]  Tobacco Use   Smoking status: Never   Smokeless tobacco: Never  Vaping Use   Vaping status: Never Used  Substance and Sexual Activity   Alcohol use: Yes    Comment: occasionally   Drug use: No   Sexual activity: Yes    Birth control/protection: Surgical  Other Topics Concern   Not on file  Social History Narrative   ** Merged History Encounter **       3 sons works age 100, 11 and 74 as of 02/13/2019   [place] since 2018  Never smoker  [employer]     Social Determinants of Health   Financial Resource Strain: Not on file  Food Insecurity: Not on file  Transportation Needs: Not on file  Physical Activity: Sufficiently Active (10/06/2017)   Exercise Vital Sign    Days of Exercise per Week:  3 days    Minutes of Exercise per Session: 50 min  Stress: Stress Concern Present (10/06/2017)   [employer] of Occupational Health - Occupational Stress Questionnaire    Feeling of Stress : Rather much  Social Connections: Unknown (12/12/2021)   Received from [facility]   Social Network    Social Network: Not on file  Intimate Partner Violence: Unknown (11/04/2021)   [facility]   HITS    Physically Hurt: Not on file    Insult or Talk Down To: Not on file    Threaten Physical Harm: Not on file    Scream or Curse: Not on file    Past Surgical History:  Procedure Laterality Date   BILATERAL SALPINGOOPHORECTOMY  2010   with LSH by Dr Luella Cook for [facility] and osis   BREAST SURGERY     left breat in 2015/2016 clogged milk duct   BREAST SURGERY Left 11/14/14   excision   CESAREAN SECTION     x1   CESAREAN SECTION  2005   COLPOSCOPY  2013   LAPAROSCOPIC SUPRACERVICAL HYSTERECTOMY  07/16/2009   LSH/BSO for leiomyoma and adenomyosis Dr Luella Cook    Family History  Problem Relation Age of Onset   Diabetes Father    Hypertension Father    Stroke Father    Hyperlipidemia Father    Breast cancer Paternal Aunt 35   Diabetes Paternal Grandmother    Diabetes Paternal Grandfather    Diabetes Maternal Aunt        several aunts with diabetes and HTN   Hypertension Maternal Aunt    Diabetes Maternal Uncle        several uncles with diabetes and HTN   Hypertension Maternal Uncle    Sickle cell anemia Maternal Aunt    Aneurysm Father        brain   Hypertension Maternal Grandfather    Diabetes Maternal Aunt        x 3     Allergies  Allergen Reactions   Sulfa Antibiotics     Mouth lips swell   Latex     Rash hives    Penicillins Hives   Penicillins    Shellfish Allergy Nausea Only   Shellfish Allergy     Itching   Sulfasalazine Nausea And Vomiting   Latex Rash   Sulfa Antibiotics Nausea And Vomiting and Rash       Latest  Ref Rng & Units 10/27/2022    8:18 AM 09/14/2021    9:14 AM 03/04/2020    9:30 AM  CBC  WBC 3.4 - 10.8 x10E3/uL  2.8  2.6  3.3   Hemoglobin 11.1 - 15.9 g/dL 56.3  87.5  64.3   Hematocrit 34.0 - 46.6 % 39.6  39.5  40.7   Platelets 150 - 450 x10E3/uL 266  276  325       CMP     Component Value Date/Time   NA 143 10/27/2022 0818   K 4.1 10/27/2022 0818   CL 103 10/27/2022 0818   CO2 22 10/27/2022 0818   GLUCOSE 99 10/27/2022 0818   BUN 8 10/27/2022 0818   CREATININE 0.72 10/27/2022 0818   CALCIUM 9.6 10/27/2022 0818   PROT 7.4 03/04/2020 0930   ALBUMIN 4.9 (H) 03/04/2020 0930   AST 43 (H) 03/04/2020 0930   ALT 64 (H) 03/04/2020 0930   ALKPHOS 59 03/04/2020 0930   BILITOT <0.2 03/04/2020 0930   EGFR 105 10/27/2022 0818   GFRNONAA 105 03/04/2020 0930     No results found.     Assessment & Plan:   1. Varicose veins of left lower extremity with pain Recommend  I have reviewed my discussion with the patient regarding  varicose veins and why they cause symptoms. Patient will continue  wearing graduated compression stockings class 1 on a daily basis, beginning first thing in the morning and removing them in the evening.  The patient is CEAP [id number].  The patient has been wearing compression for more than 12 weeks with no or little benefit.  The patient has been exercising daily for more than 12 weeks. The patient has been elevating and taking OTC pain medications for more than 12 weeks.  None of these have have eliminated the pain related to the varicose veins and venous reflux or the discomfort regarding venous congestion.    In addition, behavioral modification including elevation during the day was again discussed and this will continue.  The patient has utilized over the counter pain medications and has been exercising.  However, at this time conservative therapy has not alleviated the patient's symptoms of leg pain and swelling  Recommend: laser ablation of the  left  great saphenous veins to eliminate the symptoms of pain and swelling of the lower extremities caused by the severe superficial venous reflux disease.    Current Outpatient Medications on File Prior to Visit  Medication Sig Dispense Refill   aluminum chloride (DRYSOL) 20 % external solution Apply topically at bedtime. 35 mL 11   Fluocinolone Acetonide Body 0.01 % OIL Apply to scalp and leave on once to twice daily as needed. 120 mL 11   ketoconazole (NIZORAL) 2 % shampoo Massage into scalp, let sit several minutes before rinsing. 120 mL 11   No current facility-administered medications on file prior to visit.    There are no Patient Instructions on file for this visit. No follow-ups on file.   Georgiana Spinner, NP

## 2023-02-20 ENCOUNTER — Ambulatory Visit: Payer: PRIVATE HEALTH INSURANCE | Admitting: Dermatology

## 2023-02-20 VITALS — BP 125/81 | HR 89

## 2023-02-20 DIAGNOSIS — L820 Inflamed seborrheic keratosis: Secondary | ICD-10-CM

## 2023-02-20 DIAGNOSIS — L821 Other seborrheic keratosis: Secondary | ICD-10-CM

## 2023-02-20 MED ORDER — PIMECROLIMUS 1 % EX CREA
TOPICAL_CREAM | CUTANEOUS | 2 refills | Status: AC
Start: 2023-02-20 — End: ?

## 2023-02-20 NOTE — Patient Instructions (Addendum)
Start elidel cream - apply twice daily to affected area on neck as needed for itch    Seborrheic Keratosis  What causes seborrheic keratoses? Seborrheic keratoses are harmless, common skin growths that first appear during adult life.  As time goes by, more growths appear.  Some people may develop a large number of them.  Seborrheic keratoses appear on both covered and uncovered body parts.  They are not caused by sunlight.  The tendency to develop seborrheic keratoses can be inherited.  They vary in color from skin-colored to gray, brown, or even black.  They can be either smooth or have a rough, warty surface.   Seborrheic keratoses are superficial and look as if they were stuck on the skin.  Under the microscope this type of keratosis looks like layers upon layers of skin.  That is why at times the top layer may seem to fall off, but the rest of the growth remains and re-grows.    Treatment Seborrheic keratoses do not need to be treated, but can easily be removed in the office.  Seborrheic keratoses often cause symptoms when they rub on clothing or jewelry.  Lesions can be in the way of shaving.  If they become inflamed, they can cause itching, soreness, or burning.  Removal of a seborrheic keratosis can be accomplished by freezing, burning, or surgery. If any spot bleeds, scabs, or grows rapidly, please return to have it checked, as these can be an indication of a skin cancer.     Due to recent changes in healthcare laws, you may see results of your pathology and/or laboratory studies on MyChart before the doctors have had a chance to review them. We understand that in some cases there may be results that are confusing or concerning to you. Please understand that not all results are received at the same time and often the doctors may need to interpret multiple results in order to provide you with the best plan of care or course of treatment. Therefore, we ask that you please give [place] 2 business  days to thoroughly review all your results before contacting the office for clarification. Should we see a critical lab result, you will be contacted sooner.   If You Need Anything After Your Visit  If you have any questions or concerns for your doctor, please call our main line at [phone] and press option 4 to reach your doctor's medical assistant. If no one answers, please leave a voicemail as directed and we will return your call as soon as possible. Messages left after 4 pm will be answered the following business day.   You may also send [place] a message via MyChart. We typically respond to MyChart messages within 1-2 business days.  For prescription refills, please ask your pharmacy to contact our office. Our fax number is [phone].  If you have an urgent issue when the clinic is closed that cannot wait until the next business day, you can page your doctor at the number below.    Please note that while we do our best to be available for urgent issues outside of office hours, we are not available 24/7.   If you have an urgent issue and are unable to reach [place], you may choose to seek medical care at your doctor's office, retail clinic, urgent care center, or emergency room.  If you have a medical emergency, please immediately call 911 or go to the emergency department.  Pager Numbers  - Dr. Gwen Pounds: [phone]  -  Dr. Neale Burly: [phone]  - Dr. Roseanne Reno: [phone]  In the event of inclement weather, please call our main line at [phone] for an update on the status of any delays or closures.  Dermatology Medication Tips: Please keep the boxes that topical medications come in in order to help keep track of the instructions about where and how to use these. Pharmacies typically print the medication instructions only on the boxes and not directly on the medication tubes.   If your medication is too expensive, please contact our office at [phone] option 4 or send [place] a  message through MyChart.   We are unable to tell what your co-pay for medications will be in advance as this is different depending on your insurance coverage. However, we may be able to find a substitute medication at lower cost or fill out paperwork to get insurance to cover a needed medication.   If a prior authorization is required to get your medication covered by your insurance company, please allow [place] 1-2 business days to complete this process.  Drug prices often vary depending on where the prescription is filled and some pharmacies may offer cheaper prices.  The website www.goodrx.com contains coupons for medications through different pharmacies. The prices here do not account for what the cost may be with help from insurance (it may be cheaper with your insurance), but the website can give you the price if you did not use any insurance.  - You can print the associated coupon and take it with your prescription to the pharmacy.  - You may also stop by our office during regular business hours and pick up a GoodRx coupon card.  - If you need your prescription sent electronically to a different pharmacy, notify our office through [facility] or by phone at [phone] option 4.     Si Usted Necesita Algo Despus de Su Visita  Tambin puede enviarnos un mensaje a travs de Clinical cytogeneticist. Por lo general respondemos a los mensajes de MyChart en el transcurso de 1 a 2 das hbiles.  Para renovar recetas, por favor pida a su farmacia que se ponga en contacto con nuestra oficina. Annie Sable de fax es [place] [phone].  Si tiene un asunto urgente cuando la clnica est cerrada y que no puede esperar hasta el siguiente da hbil, puede llamar/localizar a su doctor(a) al nmero que aparece a continuacin.   Por favor, tenga en cuenta que aunque hacemos todo lo posible para estar disponibles para asuntos urgentes fuera del horario de [place], no estamos disponibles las 24 horas del da, los 7  [address]  [address] de la [place].   Si tiene un problema urgente y no puede comunicarse con nosotros, puede optar por buscar atencin mdica  en el consultorio de su doctor(a), en una clnica privada, en un centro de atencin urgente o en una sala de emergencias.  Si tiene Engineer, drilling, por favor llame inmediatamente al 911 o vaya a la sala de emergencias.  Nmeros de bper  - Dr. Gwen Pounds: [phone]  - Dra. Moye: [phone]  - Dra. Roseanne Reno: [phone]  En caso de inclemencias del [place], por favor llame a Lacy Duverney principal al [phone] para una actualizacin sobre el [place] de cualquier retraso o cierre.  Consejos para la medicacin en dermatologa: Por favor, guarde las cajas en las que vienen los medicamentos de uso tpico para ayudarle a seguir las instrucciones sobre dnde y cmo usarlos. Las farmacias generalmente imprimen las instrucciones del medicamento slo en las cajas y  no directamente en los tubos del medicamento.   Si su medicamento es muy caro, por favor, pngase en contacto con Rolm Gala llamando al [phone] y presione la opcin 4 o envenos un mensaje a travs de Clinical cytogeneticist.   No podemos decirle cul ser su copago por los medicamentos por adelantado ya que esto es diferente dependiendo de la cobertura de su seguro. Sin embargo, es posible que podamos encontrar un medicamento sustituto a Audiological scientist un formulario para que el seguro cubra el medicamento que se considera necesario.   Si se requiere una autorizacin previa para que su compaa de seguros [place] su medicamento, por favor permtanos de 1 a 2 das hbiles para completar [address]  Los precios de los medicamentos varan con frecuencia dependiendo del Environmental consultant de dnde se surte la receta y alguna farmacias pueden ofrecer precios ms baratos.  El sitio web www.goodrx.com tiene cupones para medicamentos de Health and safety inspector. Los precios aqu no tienen en cuenta lo que podra costar con  la ayuda del seguro (puede ser ms barato con su seguro), pero el sitio web puede darle el precio si no utiliz Tourist information centre manager.  - Puede imprimir el cupn correspondiente y llevarlo con su receta a la farmacia.  - Tambin puede pasar por nuestra oficina durante el horario de atencin regular y Education officer, museum una tarjeta de cupones de GoodRx.  - Si necesita que su receta se enve electrnicamente a una farmacia diferente, informe a nuestra oficina a travs de MyChart de [place] o por telfono llamando al [phone] y presione la opcin 4.

## 2023-02-20 NOTE — Progress Notes (Signed)
   Follow-Up Visit   Subjective  Christine Fry is a 47 y.o. female who presents for the following: here for 6 - 8 week follow up on cosmetic sks and isks. Patient feels still some areas at forehead and left neck that did not go away. She complains of itching at the neck.   The following portions of the chart were reviewed this encounter and updated as appropriate: medications, allergies, medical history  Review of Systems:  No other skin or systemic complaints except as noted in HPI or Assessment and Plan.  Objective  Well appearing patient in no apparent distress; mood and affect are within normal limits.   A focused examination was performed of the following areas: Face, neck   Relevant exam findings are noted in the Assessment and Plan.  left neck x 19, right neck x 25 (44) Tiny brown stuck-on, waxy papules  right upper forehead x 2 and left  zygoma x 1, Left cheek at jawx 2 Small residual waxy paps with erythema     Assessment & Plan     Inflamed seborrheic keratosis (44) left neck x 19, right neck x 25  Symptomatic, irritating, patient would like treated. With pruritus  Treat with electrodesiccation today   Start Elidel cream - apply twice daily to itchy areas of neck prn       Destruction of lesion - left neck x 19, right neck x 25 (44)  Destruction method: electrodesiccation and curettage   Destruction method comment:  Electrodesiccation only, not curretage Informed consent: discussed and consent obtained   Timeout:  patient name, date of birth, surgical site, and procedure verified Hemostasis achieved with:  electrodesiccation Outcome: patient tolerated procedure well with no complications   Post-procedure details: wound care instructions given    pimecrolimus (ELIDEL) 1 % cream - left neck x 19, right neck x 25 (44) Apply twice daily to affected itchy areas on neck  Seborrheic keratosis right upper forehead x 2 and left  zygoma x 1, Left cheek  at jawx 2  Reassured benign age-related growth.  Recommend observation.  Discussed cryotherapy if spot(s) become irritated or inflamed.  Discussed cosmetic procedure, noncovered.  $60 for 1st lesion and $15 for each additional lesion if done on the same day.  Maximum charge $350.  One touch-up treatment included no charge. Discussed risks of treatment including dyspigmentation, small scar, and/or recurrence. Recommend daily broad spectrum sunscreen SPF 30+/photoprotection to treated areas once healed.   No charge today, free touch-up x 5    Destruction of lesion - right upper forehead x 2 and left  zygoma x 1, Left cheek at jawx 2  Destruction method: electrodesiccation and curettage   Destruction method comment:  Electrodesiccation only, not curretage Informed consent: discussed and consent obtained   Timeout:  patient name, date of birth, surgical site, and procedure verified Hemostasis achieved with:  electrodesiccation Outcome: patient tolerated procedure well with no complications   Post-procedure details: wound care instructions given      Return for 4 month isk follow up.  I, Asher Muir, CMA, am acting as scribe for Christine Niece, MD.   Documentation: I have reviewed the above documentation for accuracy and completeness, and I agree with the above.  Christine Niece, MD

## 2023-03-15 ENCOUNTER — Emergency Department
Admission: EM | Admit: 2023-03-15 | Discharge: 2023-03-15 | Disposition: A | Discharge status: Home / Self Care | Payer: PRIVATE HEALTH INSURANCE | Attending: Emergency Medicine | Admitting: Emergency Medicine

## 2023-03-15 ENCOUNTER — Emergency Department: Payer: PRIVATE HEALTH INSURANCE

## 2023-03-15 ENCOUNTER — Encounter: Payer: Self-pay | Admitting: Medical Oncology

## 2023-03-15 DIAGNOSIS — R112 Nausea with vomiting, unspecified: Secondary | ICD-10-CM | POA: Insufficient documentation

## 2023-03-15 DIAGNOSIS — R197 Diarrhea, unspecified: Secondary | ICD-10-CM | POA: Insufficient documentation

## 2023-03-15 DIAGNOSIS — N3000 Acute cystitis without hematuria: Secondary | ICD-10-CM | POA: Insufficient documentation

## 2023-03-15 DIAGNOSIS — R109 Unspecified abdominal pain: Secondary | ICD-10-CM | POA: Diagnosis present

## 2023-03-15 LAB — CBC
HCT: 41.9 % (ref 36.0–46.0)
Hemoglobin: 13.6 g/dL (ref 12.0–15.0)
MCH: 28.8 pg (ref 26.0–34.0)
MCHC: 32.5 g/dL (ref 30.0–36.0)
MCV: 88.8 fL (ref 80.0–100.0)
Platelets: 260 10*3/uL (ref 150–400)
RBC: 4.72 MIL/uL (ref 3.87–5.11)
RDW: 14.2 % (ref 11.5–15.5)
WBC: 5.5 10*3/uL (ref 4.0–10.5)
nRBC: 0 % (ref 0.0–0.2)

## 2023-03-15 LAB — COMPREHENSIVE METABOLIC PANEL
ALT: 19 U/L (ref 0–44)
AST: 21 U/L (ref 15–41)
Albumin: 4.6 g/dL (ref 3.5–5.0)
Alkaline Phosphatase: 49 U/L (ref 38–126)
Anion gap: 7 (ref 5–15)
BUN: 11 mg/dL (ref 6–20)
CO2: 28 mmol/L (ref 22–32)
Calcium: 9.4 mg/dL (ref 8.9–10.3)
Chloride: 107 mmol/L (ref 98–111)
Creatinine, Ser: 0.56 mg/dL (ref 0.44–1.00)
GFR, Estimated: >60 mL/min (ref >60)
Glucose, Bld: 99 mg/dL (ref 70–99)
Potassium: 4.9 mmol/L (ref 3.5–5.1)
Sodium: 142 mmol/L (ref 135–145)
Total Bilirubin: 0.3 mg/dL (ref 0.3–1.2)
Total Protein: 7.6 g/dL (ref 6.5–8.1)

## 2023-03-15 LAB — URINALYSIS, ROUTINE W REFLEX MICROSCOPIC
Bilirubin Urine: NEGATIVE
Glucose, UA: NEGATIVE mg/dL
Hgb urine dipstick: NEGATIVE
Ketones, ur: NEGATIVE mg/dL
Leukocytes,Ua: NEGATIVE
Nitrite: POSITIVE — AB
Protein, ur: NEGATIVE mg/dL
Specific Gravity, Urine: 1.016 (ref 1.005–1.030)
pH: 7 (ref 5.0–8.0)

## 2023-03-15 LAB — LIPASE, BLOOD: Lipase: 39 U/L (ref 11–51)

## 2023-03-15 MED ORDER — ACETAMINOPHEN 500 MG PO TABS
1000.0000 mg | ORAL_TABLET | Freq: Once | ORAL | Status: AC
  Administered 2023-03-15: 1000 mg via ORAL
  Filled 2023-03-15: qty 2

## 2023-03-15 MED ORDER — IOHEXOL 300 MG/ML  SOLN
100.0000 mL | Freq: Once | INTRAMUSCULAR | Status: AC | PRN
  Administered 2023-03-15: 100 mL via INTRAVENOUS

## 2023-03-15 MED ORDER — IBUPROFEN 600 MG PO TABS
600.0000 mg | ORAL_TABLET | Freq: Once | ORAL | Status: AC
  Administered 2023-03-15: 600 mg via ORAL
  Filled 2023-03-15: qty 1

## 2023-03-15 MED ORDER — ONDANSETRON HCL 4 MG/2ML IJ SOLN
4.0000 mg | Freq: Once | INTRAMUSCULAR | Status: AC
  Administered 2023-03-15: 4 mg via INTRAVENOUS
  Filled 2023-03-15: qty 2

## 2023-03-15 MED ORDER — NITROFURANTOIN MONOHYD MACRO 100 MG PO CAPS: 100.0000 mg | ORAL_CAPSULE | Freq: Two times a day (BID) | ORAL | 0 refills | Status: AC

## 2023-03-15 MED ORDER — ONDANSETRON HCL 4 MG PO TABS: 4.0000 mg | ORAL_TABLET | Freq: Every day | ORAL | 0 refills | Status: DC | PRN

## 2023-03-15 MED ORDER — SODIUM CHLORIDE 0.9 % IV BOLUS
1000.0000 mL | Freq: Once | INTRAVENOUS | Status: AC
  Administered 2023-03-15: 1000 mL via INTRAVENOUS

## 2023-03-15 MED ORDER — MORPHINE SULFATE (PF) 4 MG/ML IV SOLN
4.0000 mg | Freq: Once | INTRAVENOUS | Status: AC
  Administered 2023-03-15: 4 mg via INTRAVENOUS
  Filled 2023-03-15: qty 1

## 2023-03-15 NOTE — Discharge Instructions (Addendum)
You were seen in the emergency department today for your abdominal pain and urinary symptoms.  There is slight concern for possible UTI which we will treat with antibiotics.  Have also sent nausea medication at home with you to take to help with the symptoms.  For pain regimen, you can take Tylenol 1000 mg every 6 hours scheduled for the next couple days.  You can also take ibuprofen 600 mg every 8 hours scheduled for the next couple of days.

## 2023-03-15 NOTE — ED Notes (Signed)
Pt. Up to bathroom indep. Gait steady, NAD. 

## 2023-03-15 NOTE — ED Provider Notes (Signed)
[facility] Provider Note    Event Date/Time   First MD Initiated Contact with Patient 03/15/23 1112     (approximate)   History   Abdominal Pain   HPI Christine Fry is a 47 y.o. female presenting today for abdominal pain.  Patient states having UTI-like symptoms with dysuria over the past several days which she has been taking Azo over-the-counter for.  Today she started having epigastric pain after taking the pill 1 time and then has had 5 episodes of vomiting since.  She has also had diarrhea several times today.  She otherwise denies fevers, chills, shortness of breath, chest pain.  Only prior abdominal surgical history includes C-section and hysterectomy.     Physical Exam   Triage Vital Signs: ED Triage Vitals [03/15/23 0926]  Encounter Vitals Group     BP (!) 138/93     Systolic BP Percentile      Diastolic BP Percentile      Pulse Rate 86     Resp 16     Temp 97.8 F (36.6 C)     Temp Source Oral     SpO2 100 %     Weight 176 lb (79.8 kg)     Height 5\' 6"  (1.676 m)     Head Circumference      Peak Flow      Pain Score 8     Pain Loc      Pain Education      Exclude from Growth Chart     Most recent vital signs: Vitals:   03/15/23 0926  BP: (!) 138/93  Pulse: 86  Resp: 16  Temp: 97.8 F (36.6 C)  SpO2: 100%   Physical Exam: I have reviewed the vital signs and nursing notes. General: Awake, alert, no acute distress.  Nontoxic appearing. Head:  Atraumatic, normocephalic.   ENT:  EOM intact, PERRL. Oral mucosa is pink and moist with no lesions. Neck: Neck is supple with full range of motion, No meningeal signs. Cardiovascular:  RRR, No murmurs. Peripheral pulses palpable and equal bilaterally. Respiratory:  Symmetrical chest wall expansion.  No rhonchi, rales, or wheezes.  Good air movement throughout.  No use of accessory muscles.   Musculoskeletal:  No cyanosis or edema. Moving extremities with full ROM Abdomen:  Soft,  tenderness to palpation in epigastric region predominantly with mild tenderness to palpation in bilateral upper quadrants, nondistended. Neuro:  GCS 15, moving all four extremities, interacting appropriately. Speech clear. Psych:  Calm, appropriate.   Skin:  Warm, dry, no rash.     ED Results / Procedures / Treatments   Labs (all labs ordered are listed, but only abnormal results are displayed) Labs Reviewed  URINALYSIS, ROUTINE W REFLEX MICROSCOPIC - Abnormal; Notable for the following components:      Result Value   Color, Urine AMBER (*)    APPearance CLEAR (*)    Nitrite POSITIVE (*)    Bacteria, UA RARE (*)    All other components within normal limits  LIPASE, BLOOD  COMPREHENSIVE METABOLIC PANEL  CBC     EKG    RADIOLOGY See ED course for my independent interpretation   PROCEDURES:  Critical Care performed: No  Procedures   MEDICATIONS ORDERED IN ED: Medications  ibuprofen (ADVIL) tablet 600 mg (has no administration in time range)  acetaminophen (TYLENOL) tablet 1,000 mg (has no administration in time range)  sodium chloride 0.9 % bolus 1,000 mL (1,000 mLs Intravenous New Bag/Given  03/15/23 1158)  ondansetron (ZOFRAN) injection 4 mg (4 mg Intravenous Given 03/15/23 1154)  morphine (PF) 4 MG/ML injection 4 mg (4 mg Intravenous Given 03/15/23 1154)  iohexol (OMNIPAQUE) 300 MG/ML solution 100 mL (100 mLs Intravenous Contrast Given 03/15/23 1231)     IMPRESSION / MDM / ASSESSMENT AND PLAN / ED COURSE  I reviewed the triage vital signs and the nursing notes.                              Differential diagnosis includes, but is not limited to, acute cystitis, pancreatitis, gastroenteritis, less likely small bowel obstruction, pyelonephritis.  Patient's presentation is most consistent with acute illness / injury with system symptoms.  Patient is a 47 year old female presenting today for urinary symptoms along with nausea, vomiting, and diarrhea.  UA has nitrites  but she is on Azo and there is a rare bacteria present.  However, CT abdomen/pelvis shows inflammation around the bladder and with her ongoing symptoms, will treat for UTI at this time.  Laboratory workup otherwise reassuring.  Patient improved with medication therapy.  She will be discharged on oral antibiotics as well as antinausea medicine.  She was given strict return precautions and follow-up with PCP.  The patient is on the cardiac monitor to evaluate for evidence of arrhythmia and/or significant heart rate changes. Clinical Course as of 03/15/23 1413  Wed Mar 15, 2023  1254 CT ABDOMEN PELVIS W CONTRAST Per my interpretation, there does appear to be some inflammation around the bladder which could be indicative of acute cystitis.  Otherwise, do not see any acute intra-abdominal pathology [DW]  1255 Urinalysis, Routine w reflex microscopic -Urine, Clean Catch(!) Nitrite positive as well as rare bacteria.  Patient is on Azo which could indicate this but she does have burning sensation while she pees as well. [DW]  1303 Reassessed patient.  Nausea and pain symptoms are significantly improved at this time [DW]    Clinical Course User Index [DW] Janith Lima, MD     FINAL CLINICAL IMPRESSION(S) / ED DIAGNOSES   Final diagnoses:  Acute cystitis without hematuria  Nausea vomiting and diarrhea     Rx / DC Orders   ED Discharge Orders          Ordered    nitrofurantoin, macrocrystal-monohydrate, (MACROBID) 100 MG capsule  2 times daily        03/15/23 1410    ondansetron (ZOFRAN) 4 MG tablet  Daily PRN        03/15/23 1410    Ambulatory Referral to Primary Care (Establish Care)        03/15/23 1411             Note:  This document was prepared using Dragon voice recognition software and may include unintentional dictation errors.   Janith Lima, MD 03/15/23 1414

## 2023-03-15 NOTE — ED Triage Notes (Signed)
Pt reports that she began having epigastric pain since yesterday with nausea and vomiting. Pt also reports last week she began having urinary sx's and started taking azo but still has UTI also.

## 2023-04-13 ENCOUNTER — Telehealth (INDEPENDENT_AMBULATORY_CARE_PROVIDER_SITE_OTHER): Payer: Self-pay | Admitting: Nurse Practitioner

## 2023-04-13 NOTE — Telephone Encounter (Signed)
Pt requested an appt online. I LVM for pt TCB and schedule appt.

## 2023-04-19 ENCOUNTER — Encounter (INDEPENDENT_AMBULATORY_CARE_PROVIDER_SITE_OTHER): Payer: Self-pay

## 2023-04-19 ENCOUNTER — Telehealth (INDEPENDENT_AMBULATORY_CARE_PROVIDER_SITE_OTHER): Payer: Self-pay

## 2023-04-19 ENCOUNTER — Other Ambulatory Visit (INDEPENDENT_AMBULATORY_CARE_PROVIDER_SITE_OTHER): Payer: Self-pay

## 2023-04-19 NOTE — Telephone Encounter (Signed)
Tried to call pt to schedule laser appt no answer and no way to leave a VM sent pt a MyChart message as well

## 2023-04-19 NOTE — Telephone Encounter (Signed)
We are ok to wait for follow up.  Based on the flight I would recommend taking 2.5 mg of eliqus BID, the day before the flight, the day of the flight and the day after.  She needs to make sure that she walks during that lay over and she should also wear compression socks

## 2023-04-20 MED ORDER — ALPRAZOLAM 0.5 MG PO TABS: ORAL_TABLET | ORAL | 0 refills | Status: DC

## 2023-06-27 ENCOUNTER — Ambulatory Visit: Payer: PRIVATE HEALTH INSURANCE | Admitting: Dermatology

## 2023-06-27 ENCOUNTER — Other Ambulatory Visit (INDEPENDENT_AMBULATORY_CARE_PROVIDER_SITE_OTHER): Payer: PRIVATE HEALTH INSURANCE | Admitting: Vascular Surgery

## 2023-07-04 ENCOUNTER — Encounter (INDEPENDENT_AMBULATORY_CARE_PROVIDER_SITE_OTHER): Payer: PRIVATE HEALTH INSURANCE

## 2023-07-10 ENCOUNTER — Encounter: Payer: Self-pay | Admitting: Nurse Practitioner

## 2023-07-10 ENCOUNTER — Ambulatory Visit: Payer: Managed Care, Other (non HMO) | Admitting: Nurse Practitioner

## 2023-07-10 VITALS — BP 133/87 | HR 93 | Temp 98.4°F | Ht 66.0 in | Wt 180.2 lb

## 2023-07-10 DIAGNOSIS — Z7689 Persons encountering health services in other specified circumstances: Secondary | ICD-10-CM

## 2023-07-10 DIAGNOSIS — F4321 Adjustment disorder with depressed mood: Secondary | ICD-10-CM

## 2023-07-10 DIAGNOSIS — E785 Hyperlipidemia, unspecified: Secondary | ICD-10-CM | POA: Diagnosis not present

## 2023-07-10 NOTE — Progress Notes (Signed)
BP 133/87 (BP Location: Left Arm, Patient Position: Sitting, Cuff Size: Normal)   Pulse 93   Temp 98.4 F (36.9 C) (Oral)   Ht 5\' 6"  (1.676 m)   Wt 180 lb 3.2 oz (81.7 kg)   LMP 06/11/2016 (Approximate)   SpO2 98%   BMI 29.09 kg/m    Subjective:    Patient ID: Christine Fry, female    DOB: 1975-09-19, 47 y.o.   MRN: [id number]  HPI: Christine Fry is a 47 y.o. female  Chief Complaint  Patient presents with   Establish Care    Meet and greet, grieving mother that passed in sept from a blood clot   Patient presents to clinic to establish care with new PCP.  Introduced to Publishing rights manager role and practice setting.  All questions answered.  Discussed provider/patient relationship and expectations.  Patient reports a history of recent loss of her mother, high cholesterol.   Patient denies a history of: Hypertension, Elevated Cholesterol, Diabetes, Thyroid problems, Depression, Anxiety, Neurological problems, and Abdominal problems.    Denies HA, CP, SOB, dizziness, palpitations, visual changes, and lower extremity swelling.   Active Ambulatory Problems    Diagnosis Date Noted   Nipple discharge in female 01/03/2013   Surgical menopause, symptomatic 10/07/2017   History of abnormal cervical Pap smear 10/07/2017   Dyspareunia in female 10/07/2017   HLD (hyperlipidemia)    Annual physical exam 02/13/2019   Resolved Ambulatory Problems    Diagnosis Date Noted   No Resolved Ambulatory Problems   Past Medical History:  Diagnosis Date   Acid reflux    Allergy    Endometriosis    Environmental allergies    Family history of breast cancer 10/2017   Past Surgical History:  Procedure Laterality Date   BILATERAL SALPINGOOPHORECTOMY  2010   with LSH by Dr Luella Cook for [facility] and osis   BREAST SURGERY     left breat in 2015/2016 clogged milk duct   BREAST SURGERY Left 11/14/14   excision   CESAREAN SECTION     x1   CESAREAN SECTION  2005   COLPOSCOPY  2013    LAPAROSCOPIC SUPRACERVICAL HYSTERECTOMY  07/16/2009   LSH/BSO for leiomyoma and adenomyosis Dr Luella Cook   Family History  Problem Relation Age of Onset   Diabetes Father    Hypertension Father    Stroke Father    Hyperlipidemia Father    Breast cancer Paternal Aunt 34   Diabetes Paternal Grandmother    Diabetes Paternal Grandfather    Diabetes Maternal Aunt        several aunts with diabetes and HTN   Hypertension Maternal Aunt    Diabetes Maternal Uncle        several uncles with diabetes and HTN   Hypertension Maternal Uncle    Sickle cell anemia Maternal Aunt    Aneurysm Father        brain   Hypertension Maternal Grandfather    Diabetes Maternal Aunt        x 3      Review of Systems  Eyes:  Negative for visual disturbance.  Respiratory:  Negative for cough, chest tightness and shortness of breath.   Cardiovascular:  Negative for chest pain, palpitations and leg swelling.  Neurological:  Negative for dizziness and headaches.  Psychiatric/Behavioral:         Grief    Per HPI unless specifically indicated above     Objective:    BP 133/87 (BP Location: Left  Arm, Patient Position: Sitting, Cuff Size: Normal)   Pulse 93   Temp 98.4 F (36.9 C) (Oral)   Ht 5\' 6"  (1.676 m)   Wt 180 lb 3.2 oz (81.7 kg)   LMP 06/11/2016 (Approximate)   SpO2 98%   BMI 29.09 kg/m   Wt Readings from Last 3 Encounters:  07/10/23 180 lb 3.2 oz (81.7 kg)  03/15/23 176 lb (79.8 kg)  02/15/23 175 lb (79.4 kg)    Physical Exam Vitals and nursing note reviewed.  Constitutional:      General: She is not in acute distress.    Appearance: Normal appearance. She is normal weight. She is not ill-appearing, toxic-appearing or diaphoretic.  HENT:     Head: Normocephalic.     Right Ear: External ear normal.     Left Ear: External ear normal.     Nose: Nose normal.     Mouth/Throat:     Mouth: Mucous membranes are moist.     Pharynx: Oropharynx is clear.  Eyes:     General:        Right  eye: No discharge.        Left eye: No discharge.     Extraocular Movements: Extraocular movements intact.     Conjunctiva/sclera: Conjunctivae normal.     Pupils: Pupils are equal, round, and reactive to light.  Cardiovascular:     Rate and Rhythm: Normal rate and regular rhythm.     Heart sounds: No murmur heard. Pulmonary:     Effort: Pulmonary effort is normal. No respiratory distress.     Breath sounds: Normal breath sounds. No wheezing or rales.  Musculoskeletal:     Cervical back: Normal range of motion and neck supple.  Skin:    General: Skin is warm and dry.     Capillary Refill: Capillary refill takes less than 2 seconds.  Neurological:     General: No focal deficit present.     Mental Status: She is alert and oriented to person, place, and time. Mental status is at baseline.  Psychiatric:        Mood and Affect: Mood normal.        Behavior: Behavior normal.        Thought Content: Thought content normal.        Judgment: Judgment normal.     Results for orders placed or performed during the hospital encounter of 03/15/23  Lipase, blood  Result Value Ref Range   Lipase 39 11 - 51 U/L  Comprehensive metabolic panel  Result Value Ref Range   Sodium 142 135 - 145 mmol/L   Potassium 4.9 3.5 - 5.1 mmol/L   Chloride 107 98 - 111 mmol/L   CO2 28 22 - 32 mmol/L   Glucose, Bld 99 70 - 99 mg/dL   BUN 11 6 - 20 mg/dL   Creatinine, Ser 8.65 0.44 - 1.00 mg/dL   Calcium 9.4 8.9 - 78.4 mg/dL   Total Protein 7.6 6.5 - 8.1 g/dL   Albumin 4.6 3.5 - 5.0 g/dL   AST 21 15 - 41 U/L   ALT 19 0 - 44 U/L   Alkaline Phosphatase 49 38 - 126 U/L   Total Bilirubin 0.3 0.3 - 1.2 mg/dL   GFR, Estimated >69 >62 mL/min   Anion gap 7 5 - 15  CBC  Result Value Ref Range   WBC 5.5 4.0 - 10.5 K/uL   RBC 4.72 3.87 - 5.11 MIL/uL   Hemoglobin 13.6 12.0 -  15.0 g/dL   HCT 02.7 25.3 - 66.4 %   MCV 88.8 80.0 - 100.0 fL   MCH 28.8 26.0 - 34.0 pg   MCHC 32.5 30.0 - 36.0 g/dL   RDW 40.3 47.4 -  25.9 %   Platelets 260 150 - 400 K/uL   nRBC 0.0 0.0 - 0.2 %  Urinalysis, Routine w reflex microscopic -Urine, Clean Catch  Result Value Ref Range   Color, Urine AMBER (A) YELLOW   APPearance CLEAR (A) CLEAR   Specific Gravity, Urine 1.016 1.005 - 1.030   pH 7.0 5.0 - 8.0   Glucose, UA NEGATIVE NEGATIVE mg/dL   Hgb urine dipstick NEGATIVE NEGATIVE   Bilirubin Urine NEGATIVE NEGATIVE   Ketones, ur NEGATIVE NEGATIVE mg/dL   Protein, ur NEGATIVE NEGATIVE mg/dL   Nitrite POSITIVE (A) NEGATIVE   Leukocytes,Ua NEGATIVE NEGATIVE   RBC / HPF 0-5 0 - 5 RBC/hpf   WBC, UA 0-5 0 - 5 WBC/hpf   Bacteria, UA RARE (A) NONE SEEN   Squamous Epithelial / HPF 0-5 0 - 5 /HPF   Mucus PRESENT       Assessment & Plan:   Problem List Items Addressed This Visit       Other   HLD (hyperlipidemia)    History of hyperlipidemia.  Will check labs at next visit.  Follow up in 1 month.  Call sooner if concerns arise.       Other Visit Diagnoses     Grief    -  Primary   Lost her mom suddenly in September.  Patient is still greiving her loss.   Encounter to establish care            Follow up plan: Return in about 1 month (around 08/10/2023) for Physical and Fasting labs.

## 2023-07-10 NOTE — Assessment & Plan Note (Signed)
History of hyperlipidemia.  Will check labs at next visit.  Follow up in 1 month.  Call sooner if concerns arise.

## 2023-07-14 ENCOUNTER — Other Ambulatory Visit (INDEPENDENT_AMBULATORY_CARE_PROVIDER_SITE_OTHER): Payer: PRIVATE HEALTH INSURANCE | Admitting: Vascular Surgery

## 2023-07-18 ENCOUNTER — Ambulatory Visit (LOCAL_COMMUNITY_HEALTH_CENTER): Payer: Self-pay

## 2023-07-18 DIAGNOSIS — Z111 Encounter for screening for respiratory tuberculosis: Secondary | ICD-10-CM

## 2023-07-21 ENCOUNTER — Ambulatory Visit: Payer: Self-pay

## 2023-07-21 DIAGNOSIS — Z111 Encounter for screening for respiratory tuberculosis: Secondary | ICD-10-CM

## 2023-07-21 LAB — TB SKIN TEST
Induration: 0 mm
TB Skin Test: NEGATIVE

## 2023-08-08 ENCOUNTER — Ambulatory Visit: Payer: PRIVATE HEALTH INSURANCE | Admitting: Dermatology

## 2023-08-10 ENCOUNTER — Ambulatory Visit (INDEPENDENT_AMBULATORY_CARE_PROVIDER_SITE_OTHER): Payer: PRIVATE HEALTH INSURANCE | Admitting: Nurse Practitioner

## 2023-08-10 ENCOUNTER — Ambulatory Visit (INDEPENDENT_AMBULATORY_CARE_PROVIDER_SITE_OTHER): Payer: Managed Care, Other (non HMO) | Admitting: Nurse Practitioner

## 2023-08-10 ENCOUNTER — Encounter: Payer: Self-pay | Admitting: Nurse Practitioner

## 2023-08-10 VITALS — BP 123/86 | HR 89 | Temp 98.1°F | Wt 182.6 lb

## 2023-08-10 DIAGNOSIS — Z1211 Encounter for screening for malignant neoplasm of colon: Secondary | ICD-10-CM | POA: Diagnosis not present

## 2023-08-10 DIAGNOSIS — Z23 Encounter for immunization: Secondary | ICD-10-CM

## 2023-08-10 DIAGNOSIS — Z0000 Encounter for general adult medical examination without abnormal findings: Secondary | ICD-10-CM | POA: Diagnosis not present

## 2023-08-10 DIAGNOSIS — E785 Hyperlipidemia, unspecified: Secondary | ICD-10-CM

## 2023-08-10 LAB — URINALYSIS, ROUTINE W REFLEX MICROSCOPIC
Bilirubin, UA: NEGATIVE
Glucose, UA: NEGATIVE
Ketones, UA: NEGATIVE
Leukocytes,UA: NEGATIVE
Nitrite, UA: NEGATIVE
Protein,UA: NEGATIVE
Specific Gravity, UA: 1.025 (ref 1.005–1.030)
Urobilinogen, Ur: 0.2 mg/dL (ref 0.2–1.0)
pH, UA: 6 (ref 5.0–7.5)

## 2023-08-10 LAB — MICROSCOPIC EXAMINATION: Bacteria, UA: NONE SEEN

## 2023-08-10 NOTE — Assessment & Plan Note (Signed)
 Health maintenance reviewed during visit today. Labs ordered. TDAP given. Colonoscopy ordered.

## 2023-08-10 NOTE — Progress Notes (Signed)
 BP 123/86 (BP Location: Left Arm, Patient Position: Sitting, Cuff Size: Normal)   Pulse 89   Temp 98.1 F (36.7 C) (Oral)   Wt 182 lb 9.6 oz (82.8 kg)   LMP 06/11/2016 (Approximate)   SpO2 99%   BMI 29.47 kg/m    Subjective:    Patient ID: Christine Fry, female    DOB: 1976/06/03, 48 y.o.   MRN: [id number]  HPI: Christine Fry is a 48 y.o. female presenting on 08/10/2023 for comprehensive medical examination. Current medical complaints include:none  She currently lives with: Menopausal Symptoms: no  HYPERLIPIDEMIA Hyperlipidemia status: excellent compliance Satisfied with current treatment?  yes Side effects:  no Medication compliance: excellent compliance Past cholesterol meds: none Supplements: none Aspirin:  no The 10-year ASCVD risk score (Arnett DK, et al., 2019) is: 0.7%   Values used to calculate the score:     Age: 85 years     Sex: Female     Is Non-Hispanic African American: No     Diabetic: No     Tobacco smoker: No     Systolic Blood Pressure: 123 mmHg     Is BP treated: No     HDL Cholesterol: 68 mg/dL     Total Cholesterol: 231 mg/dL Chest pain:  no Coronary artery disease:  no Family history CAD:  no Family history early CAD:  no   Denies HA, CP, SOB, dizziness, palpitations, visual changes, and lower extremity swelling.    Depression Screen done today and results listed below:     08/10/2023    8:35 AM 07/10/2023    8:49 AM 10/05/2021    3:01 PM 03/10/2020    9:21 AM 02/20/2020    8:44 AM  Depression screen PHQ 2/9  Decreased Interest 0 1 0 1 0  Down, Depressed, Hopeless 0 2 0 1 0  PHQ - 2 Score 0 3 0 2 0  Altered sleeping 1 1     Tired, decreased energy 0 3     Change in appetite 3 3     Feeling bad or failure about yourself  1 1     Trouble concentrating 0 0     Moving slowly or fidgety/restless 0 0     Suicidal thoughts 0 0     PHQ-9 Score 5 11       The patient does not have a history of falls. I did complete a risk assessment  for falls. A plan of care for falls was documented.   Past Medical History:  Past Medical History:  Diagnosis Date   Acid reflux    Allergy    used to get allergy shots with ENT last in 2015   Endometriosis    Environmental allergies    Family history of breast cancer 10/2017   cancer genetic testing letter sent   History of abnormal cervical Pap smear 10/14/2011   LGSIL   HLD (hyperlipidemia)    Nipple discharge in female 01/03/2013    Surgical History:  Past Surgical History:  Procedure Laterality Date   BILATERAL SALPINGOOPHORECTOMY  2010   with LSH by Dr Rolm for [facility] and osis   BREAST SURGERY     left breat in 2015/2016 clogged milk duct   BREAST SURGERY Left 11/14/14   excision   CESAREAN SECTION     x1   CESAREAN SECTION  2005   COLPOSCOPY  2013   LAPAROSCOPIC SUPRACERVICAL HYSTERECTOMY  07/16/2009   LSH/BSO for leiomyoma and adenomyosis  Dr Rolm    Medications:  Current Outpatient Medications on File Prior to Visit  Medication Sig   aluminum  chloride (DRYSOL) 20 % external solution Apply topically at bedtime.   Fluocinolone  Acetonide Body 0.01 % OIL Apply to scalp and leave on once to twice daily as needed.   ketoconazole  (NIZORAL ) 2 % shampoo Massage into scalp, let sit several minutes before rinsing.   pimecrolimus  (ELIDEL ) 1 % cream Apply twice daily to affected itchy areas on neck   No current facility-administered medications on file prior to visit.    Allergies:  Allergies  Allergen Reactions   Sulfa Antibiotics     Mouth lips swell   Latex     Rash hives    Penicillins Hives   Penicillins    Shellfish Allergy Nausea Only   Shellfish Allergy     Itching   Sulfasalazine Nausea And Vomiting   Latex Rash   Sulfa Antibiotics Nausea And Vomiting and Rash    Social History:  Social History   Socioeconomic History   Marital status: Significant Other    Spouse name: Not on file   Number of children: 2   Years of education: 14   Highest  education level: Not on file  Occupational History   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 TE[employer]  Tobacco Use   Smoking status: Never   Smokeless tobacco: Never  Vaping Use   Vaping status: Never Used  Substance and Sexual Activity   Alcohol use: Yes    Comment: occasionally   Drug use: No   Sexual activity: Yes    Birth control/protection: Surgical  Other Topics Concern   Not on file  Social History Narrative   ** Merged History Encounter **       3 sons works age 106, 42 and 64 as of 02/13/2019   Sharmet since 2018  Never smoker  [employer]     Social Drivers of Health   Financial Resource Strain: Not on file  Food Insecurity: No Food Insecurity (07/10/2023)   Hunger Vital Sign    Worried About Running Out of Food in the Last Year: Never true    Ran Out of Food in the Last Year: Never true  Transportation Needs: No Transportation Needs (07/10/2023)   PRAPARE - Administrator, Civil Service (Medical): No    Lack of Transportation (Non-Medical): No  Physical Activity: Sufficiently Active (10/06/2017)   Exercise Vital Sign    Days of Exercise per Week: 3 days    Minutes of Exercise per Session: 50 min  Stress: Stress Concern Present (10/06/2017)   [employer] of Occupational Health - Occupational Stress Questionnaire    Feeling of Stress : Rather much  Social Connections: Unknown (07/10/2023)   Social Connection and Isolation Panel [NHANES]    Frequency of Communication with Friends and Family: Once a week    Frequency of Social Gatherings with Friends and Family: Once a week    Attends Religious Services: Not on Insurance Claims Handler of Clubs or Organizations: Not on file    Attends Banker Meetings: Not on file    Marital Status: Not on file  Intimate Partner Violence: Not At Risk (07/10/2023)   Humiliation, Afraid, Rape, and Kick questionnaire    Fear of Current or Ex-Partner: No    Emotionally Abused: No    Physically Abused: No    Sexually Abused: No    Social History   Tobacco Use  Smoking Status Never  Smokeless Tobacco Never   Social History   Substance and Sexual Activity  Alcohol Use Yes   Comment: occasionally    Family History:  Family History  Problem Relation Age of Onset   Diabetes Father    Hypertension Father    Stroke Father    Hyperlipidemia Father    Breast cancer Paternal Aunt 66   Diabetes Paternal Grandmother    Diabetes Paternal Grandfather    Diabetes Maternal Aunt        several aunts with diabetes and HTN   Hypertension Maternal Aunt    Diabetes Maternal Uncle        several uncles with diabetes and HTN   Hypertension Maternal Uncle    Sickle cell anemia Maternal Aunt    Aneurysm Father        brain   Hypertension Maternal Grandfather    Diabetes Maternal Aunt        x 3     Past medical history, surgical history, medications, allergies, family history and social history reviewed with patient today and changes made to appropriate areas of the chart.   Review of Systems  Eyes:  Negative for blurred vision and double vision.  Respiratory:  Negative for shortness of breath.   Cardiovascular:  Negative for chest pain, palpitations and leg swelling.  Neurological:  Negative for dizziness and headaches.   All other ROS negative except what is listed above and in the HPI.      Objective:    BP 123/86 (BP Location: Left Arm, Patient Position: Sitting, Cuff Size: Normal)   Pulse 89   Temp 98.1 F (36.7 C) (Oral)   Wt 182 lb 9.6 oz (82.8 kg)   LMP 06/11/2016 (Approximate)   SpO2 99%   BMI 29.47 kg/m   Wt Readings from Last 3 Encounters:  08/10/23 182 lb 9.6 oz (82.8 kg)  07/10/23 180 lb 3.2 oz (81.7 kg)  03/15/23 176 lb (79.8 kg)    Physical Exam Vitals and nursing note reviewed.  Constitutional:      General: She is awake. She is not in acute distress.    Appearance: Normal appearance. She is well-developed. She is not ill-appearing.  HENT:     Head: Normocephalic and atraumatic.      Right Ear: Hearing, tympanic membrane, ear canal and external ear normal. No drainage.     Left Ear: Hearing, tympanic membrane, ear canal and external ear normal. No drainage.     Nose: Nose normal.     Right Sinus: No maxillary sinus tenderness or frontal sinus tenderness.     Left Sinus: No maxillary sinus tenderness or frontal sinus tenderness.     Mouth/Throat:     Mouth: Mucous membranes are moist.     Pharynx: Oropharynx is clear. Uvula midline. No pharyngeal swelling, oropharyngeal exudate or posterior oropharyngeal erythema.  Eyes:     General: Lids are normal.        Right eye: No discharge.        Left eye: No discharge.     Extraocular Movements: Extraocular movements intact.     Conjunctiva/sclera: Conjunctivae normal.     Pupils: Pupils are equal, round, and reactive to light.     Visual Fields: Right eye visual fields normal and left eye visual fields normal.  Neck:     Thyroid : No thyromegaly.     Vascular: No carotid bruit.     Trachea: Trachea normal.  Cardiovascular:     Rate and Rhythm: Normal  rate and regular rhythm.     Heart sounds: Normal heart sounds. No murmur heard.    No gallop.  Pulmonary:     Effort: Pulmonary effort is normal. No accessory muscle usage or respiratory distress.     Breath sounds: Normal breath sounds.  Chest:  Breasts:    Right: Normal.     Left: Normal.  Abdominal:     General: Bowel sounds are normal.     Palpations: Abdomen is soft. There is no hepatomegaly or splenomegaly.     Tenderness: There is no abdominal tenderness.  Musculoskeletal:        General: Normal range of motion.     Cervical back: Normal range of motion and neck supple.     Right lower leg: No edema.     Left lower leg: No edema.  Lymphadenopathy:     Head:     Right side of head: No submental, submandibular, tonsillar, preauricular or posterior auricular adenopathy.     Left side of head: No submental, submandibular, tonsillar, preauricular or  posterior auricular adenopathy.     Cervical: No cervical adenopathy.     Upper Body:     Right upper body: No supraclavicular, axillary or pectoral adenopathy.     Left upper body: No supraclavicular, axillary or pectoral adenopathy.  Skin:    General: Skin is warm and dry.     Capillary Refill: Capillary refill takes less than 2 seconds.     Findings: No rash.  Neurological:     Mental Status: She is alert and oriented to person, place, and time.     Gait: Gait is intact.  Psychiatric:        Attention and Perception: Attention normal.        Mood and Affect: Mood normal.        Speech: Speech normal.        Behavior: Behavior normal. Behavior is cooperative.        Thought Content: Thought content normal.        Judgment: Judgment normal.     Results for orders placed or performed in visit on 07/18/23  TB Skin Test   Collection Time: 07/21/23  8:16 AM  Result Value Ref Range   TB Skin Test Negative    Induration 0 mm      Assessment & Plan:   Problem List Items Addressed This Visit       Other   HLD (hyperlipidemia)   Labs ordered at visit today.  Will make recommendations based on lab results.        Relevant Orders   Lipid panel   Annual physical exam - Primary   Health maintenance reviewed during visit today. Labs ordered. TDAP given. Colonoscopy ordered.       Relevant Orders   CBC with Differential/Platelet   Comprehensive metabolic panel   Lipid panel   TSH   Urinalysis, Routine w reflex microscopic   Other Visit Diagnoses       Need for tetanus booster       Relevant Orders   Tdap vaccine greater than or equal to 7yo IM     Screening for colon cancer       Relevant Orders   Ambulatory referral to Gastroenterology        Follow up plan: Return in about 1 year (around 08/09/2024) for Physical and Fasting labs.   LABORATORY TESTING:  - Pap smear: up to date  IMMUNIZATIONS:   - Tdap: Tetanus  vaccination status reviewed: last tetanus  booster within 10 years. - Influenza: Refused - Pneumovax: Not applicable - Prevnar: Not applicable - COVID: Not applicable - HPV: Administered today - Shingrix vaccine: Not applicable  SCREENING: -Mammogram: Up to date  - Colonoscopy: Ordered today  - Bone Density: Not applicable  -Hearing Test: Not applicable  -Spirometry: Not applicable   PATIENT COUNSELING:   Advised to take 1 mg of folate supplement per day if capable of pregnancy.   Sexuality: Discussed sexually transmitted diseases, partner selection, use of condoms, avoidance of unintended pregnancy  and contraceptive alternatives.   Advised to avoid cigarette smoking.  I discussed with the patient that most people either abstain from alcohol or drink within safe limits (<=14/week and <=4 drinks/occasion for males, <=7/weeks and <= 3 drinks/occasion for females) and that the risk for alcohol disorders and other health effects rises proportionally with the number of drinks per week and how often a drinker exceeds daily limits.  Discussed cessation/primary prevention of drug use and availability of treatment for abuse.   Diet: Encouraged to adjust caloric intake to maintain  or achieve ideal body weight, to reduce intake of dietary saturated fat and total fat, to limit sodium intake by avoiding high sodium foods and not adding table salt, and to maintain adequate dietary potassium and calcium preferably from fresh fruits, vegetables, and low-fat dairy products.    stressed the importance of regular exercise  Injury prevention: Discussed safety belts, safety helmets, smoke detector, smoking near bedding or upholstery.   Dental health: Discussed importance of regular tooth brushing, flossing, and dental visits.    NEXT PREVENTATIVE PHYSICAL DUE IN 1 YEAR. Return in about 1 year (around 08/09/2024) for Physical and Fasting labs.

## 2023-08-10 NOTE — Assessment & Plan Note (Signed)
 Labs ordered at visit today.  Will make recommendations based on lab results.

## 2023-08-11 LAB — COMPREHENSIVE METABOLIC PANEL
ALT: 19 [IU]/L (ref 0–32)
AST: 21 [IU]/L (ref 0–40)
Albumin: 4.7 g/dL (ref 3.9–4.9)
Alkaline Phosphatase: 54 [IU]/L (ref 44–121)
BUN/Creatinine Ratio: 14 (ref 9–23)
BUN: 9 mg/dL (ref 6–24)
Bilirubin Total: 0.3 mg/dL (ref 0.0–1.2)
CO2: 24 mmol/L (ref 20–29)
Calcium: 9.4 mg/dL (ref 8.7–10.2)
Chloride: 104 mmol/L (ref 96–106)
Creatinine, Ser: 0.65 mg/dL (ref 0.57–1.00)
Globulin, Total: 2.4 g/dL (ref 1.5–4.5)
Glucose: 86 mg/dL (ref 70–99)
Potassium: 4.2 mmol/L (ref 3.5–5.2)
Sodium: 142 mmol/L (ref 134–144)
Total Protein: 7.1 g/dL (ref 6.0–8.5)
eGFR: 111 mL/min/{1.73_m2} (ref >59)

## 2023-08-11 LAB — CBC WITH DIFFERENTIAL/PLATELET
Basophils Absolute: 0 10*3/uL (ref 0.0–0.2)
Basos: 1 %
EOS (ABSOLUTE): 0 10*3/uL (ref 0.0–0.4)
Eos: 1 %
Hematocrit: 39.4 % (ref 34.0–46.6)
Hemoglobin: 12.8 g/dL (ref 11.1–15.9)
Immature Grans (Abs): 0 10*3/uL (ref 0.0–0.1)
Immature Granulocytes: 0 %
Lymphocytes Absolute: 1.7 10*3/uL (ref 0.7–3.1)
Lymphs: 50 %
MCH: 29.6 pg (ref 26.6–33.0)
MCHC: 32.5 g/dL (ref 31.5–35.7)
MCV: 91 fL (ref 79–97)
Monocytes Absolute: 0.3 10*3/uL (ref 0.1–0.9)
Monocytes: 9 %
Neutrophils Absolute: 1.4 10*3/uL (ref 1.4–7.0)
Neutrophils: 39 %
Platelets: 311 10*3/uL (ref 150–450)
RBC: 4.33 x10E6/uL (ref 3.77–5.28)
RDW: 14 % (ref 11.7–15.4)
WBC: 3.5 10*3/uL (ref 3.4–10.8)

## 2023-08-11 LAB — LIPID PANEL
Chol/HDL Ratio: 2.4 {ratio} (ref 0.0–4.4)
Cholesterol, Total: 233 mg/dL — ABNORMAL HIGH (ref 100–199)
HDL: 96 mg/dL (ref >39)
LDL Chol Calc (NIH): 115 mg/dL — ABNORMAL HIGH (ref 0–99)
Triglycerides: 130 mg/dL (ref 0–149)
VLDL Cholesterol Cal: 22 mg/dL (ref 5–40)

## 2023-08-11 LAB — TSH: TSH: 0.791 u[IU]/mL (ref 0.450–4.500)

## 2023-08-24 ENCOUNTER — Encounter: Payer: Self-pay | Admitting: *Deleted

## 2023-08-25 IMAGING — CR DG LUMBAR SPINE COMPLETE 4+V
1 series · 5 of 5 positions shown · non-contrast
Comparison: X-ray lumbar spine 06/20/2018

CLINICAL DATA: Acute left lower back pain, radiating down to left
ankle.

EXAM:
LUMBAR SPINE - COMPLETE 4+ VIEW

[Series 1: dg lumbar spine complete 4 +v · 0.14mm/px · 5 of 5 slices shown]
[im 1/5]
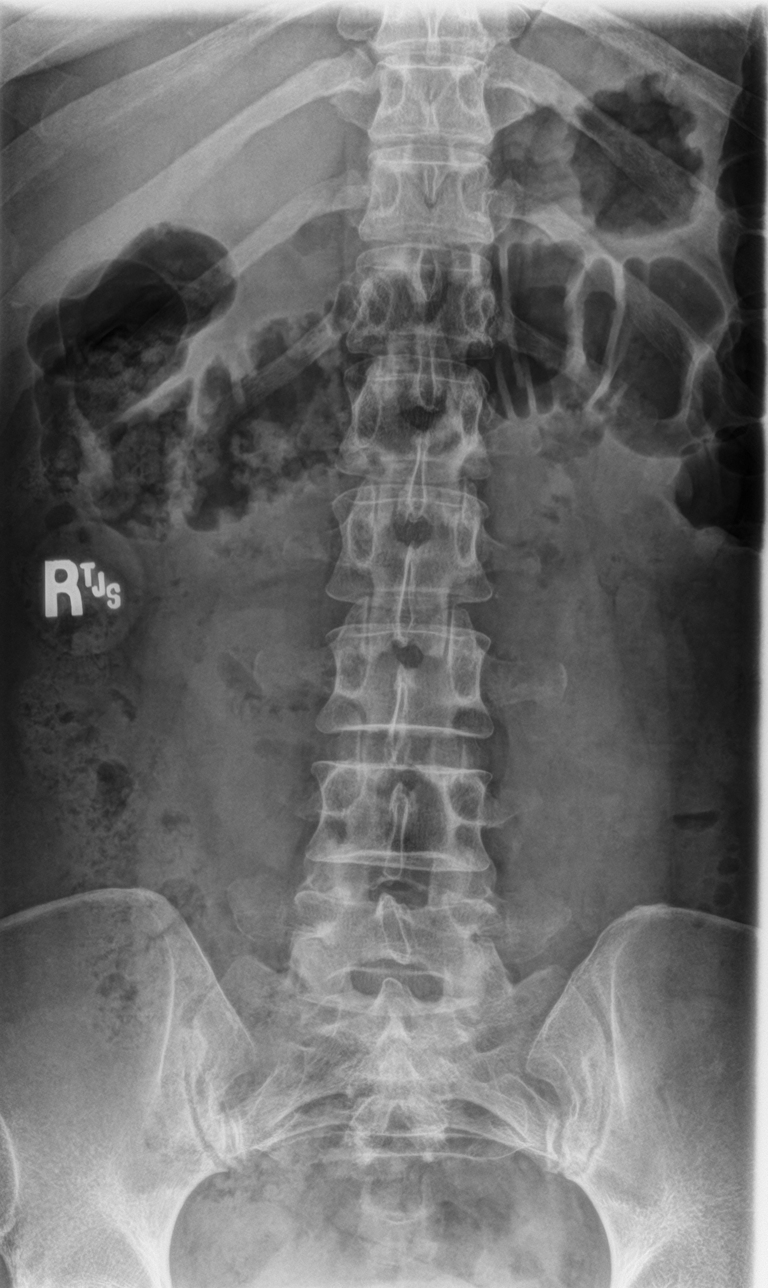
[im 2/5]
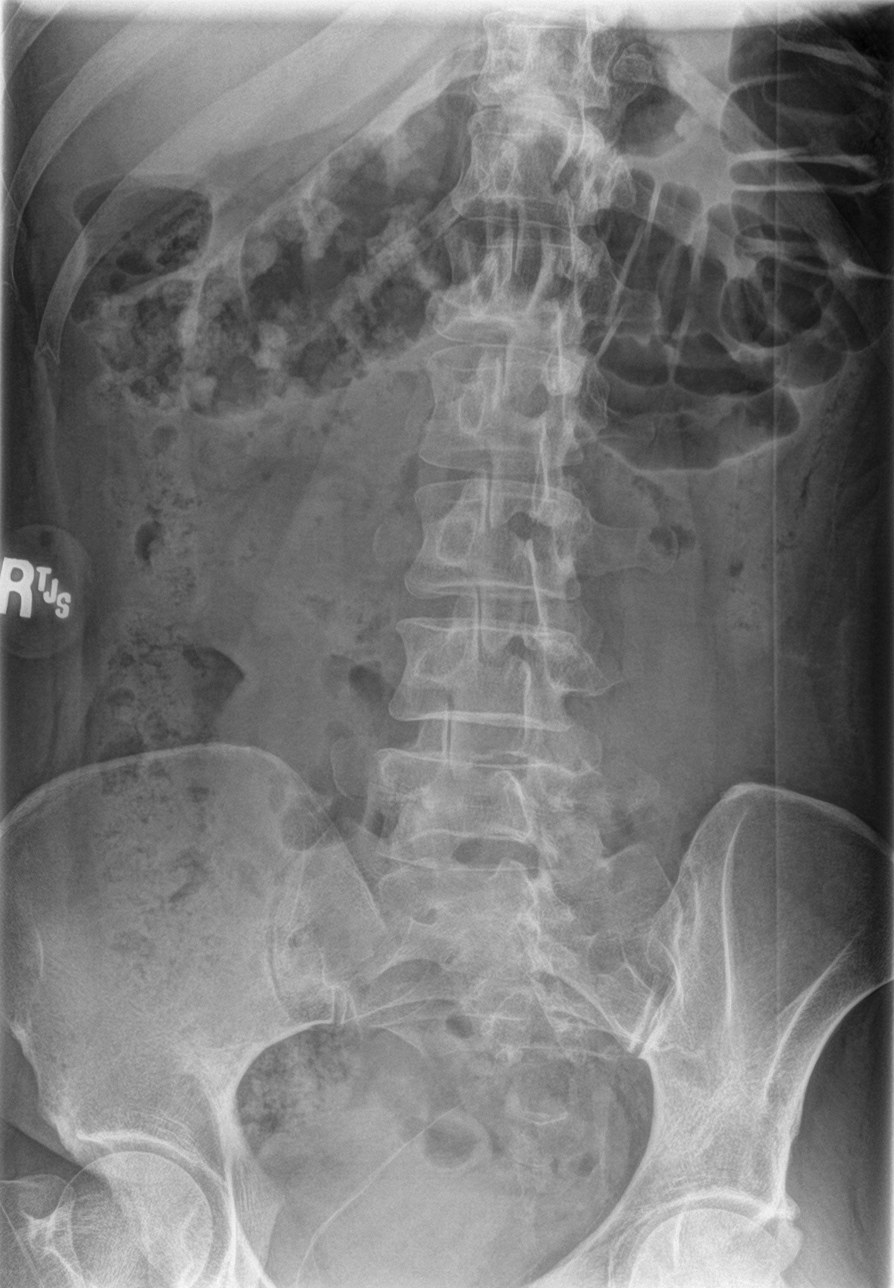
[im 3/5]
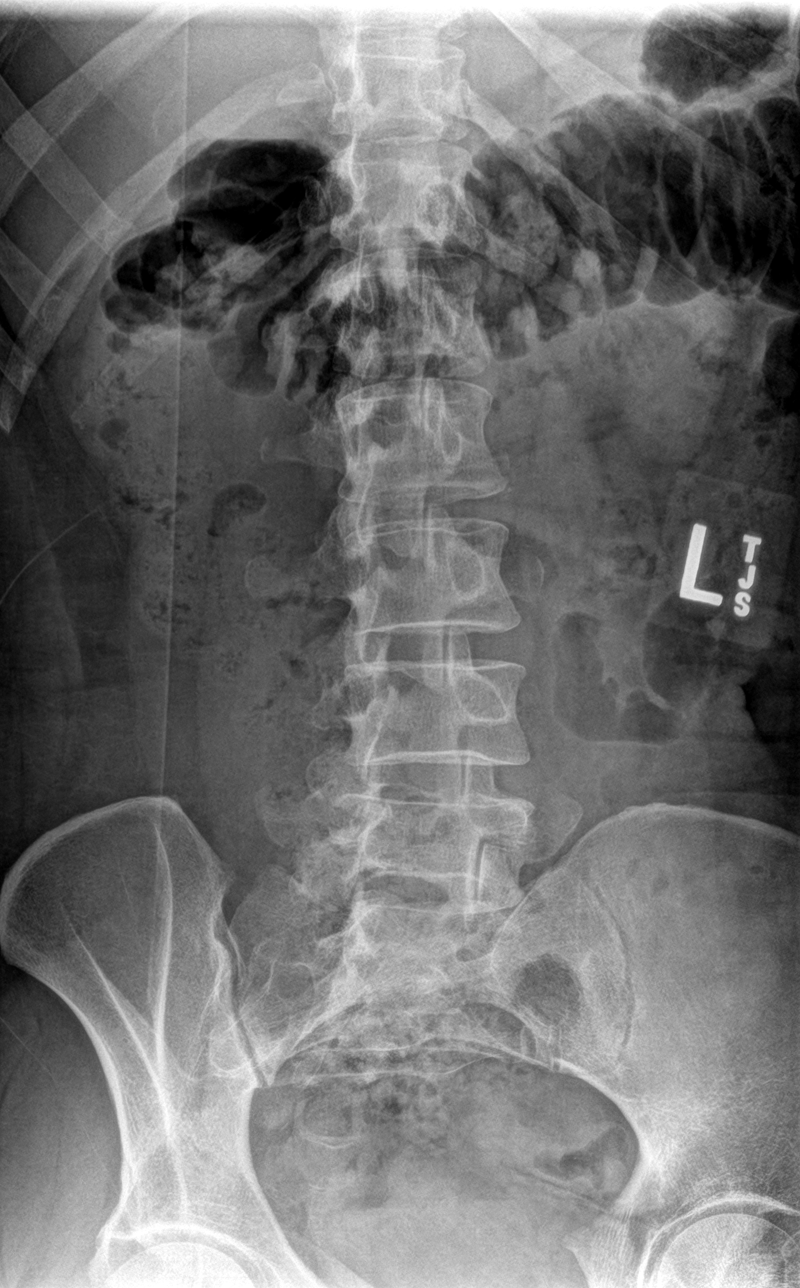
[im 4/5]
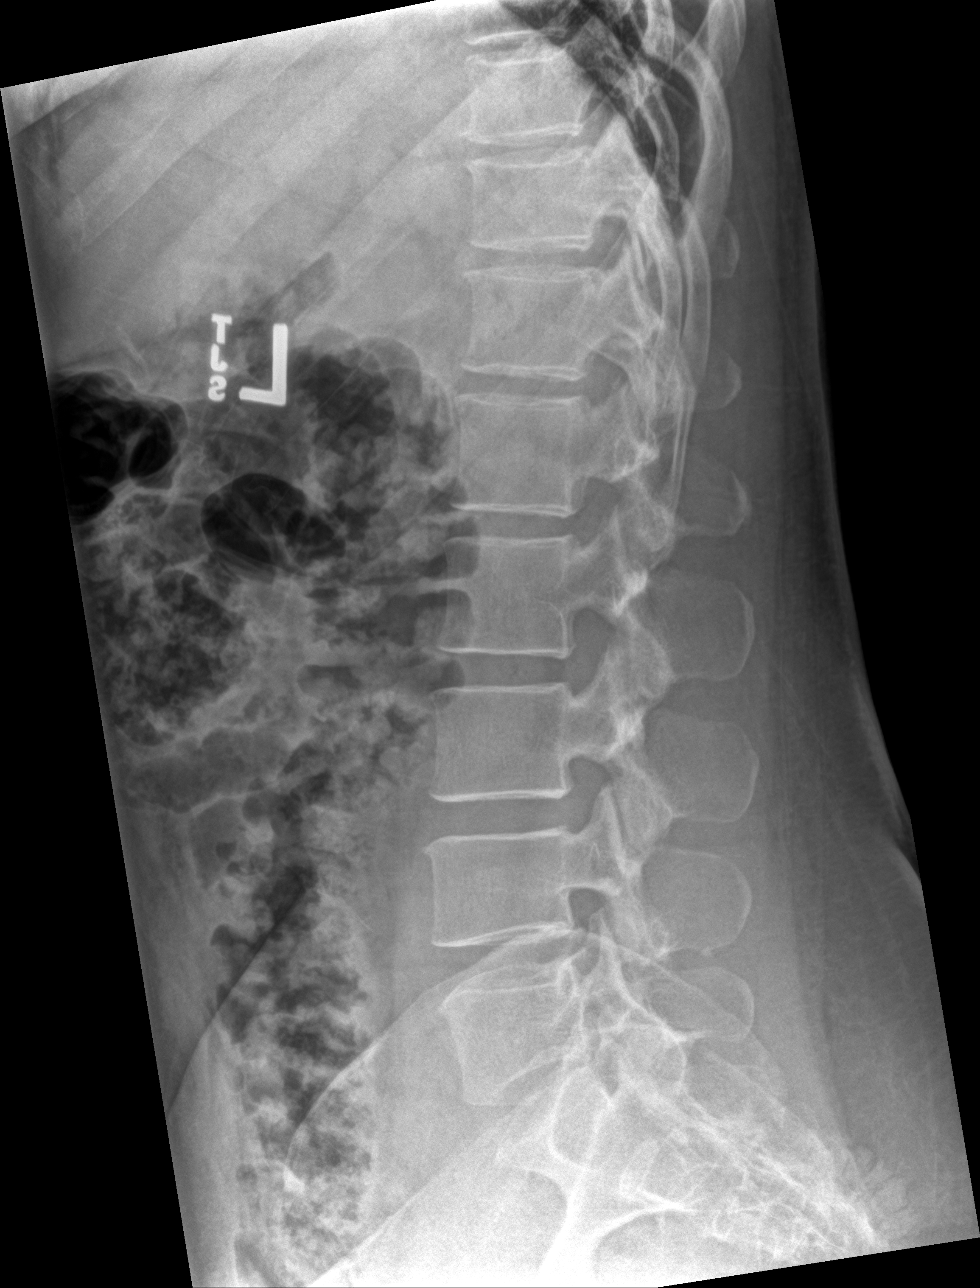
[im 5/5]
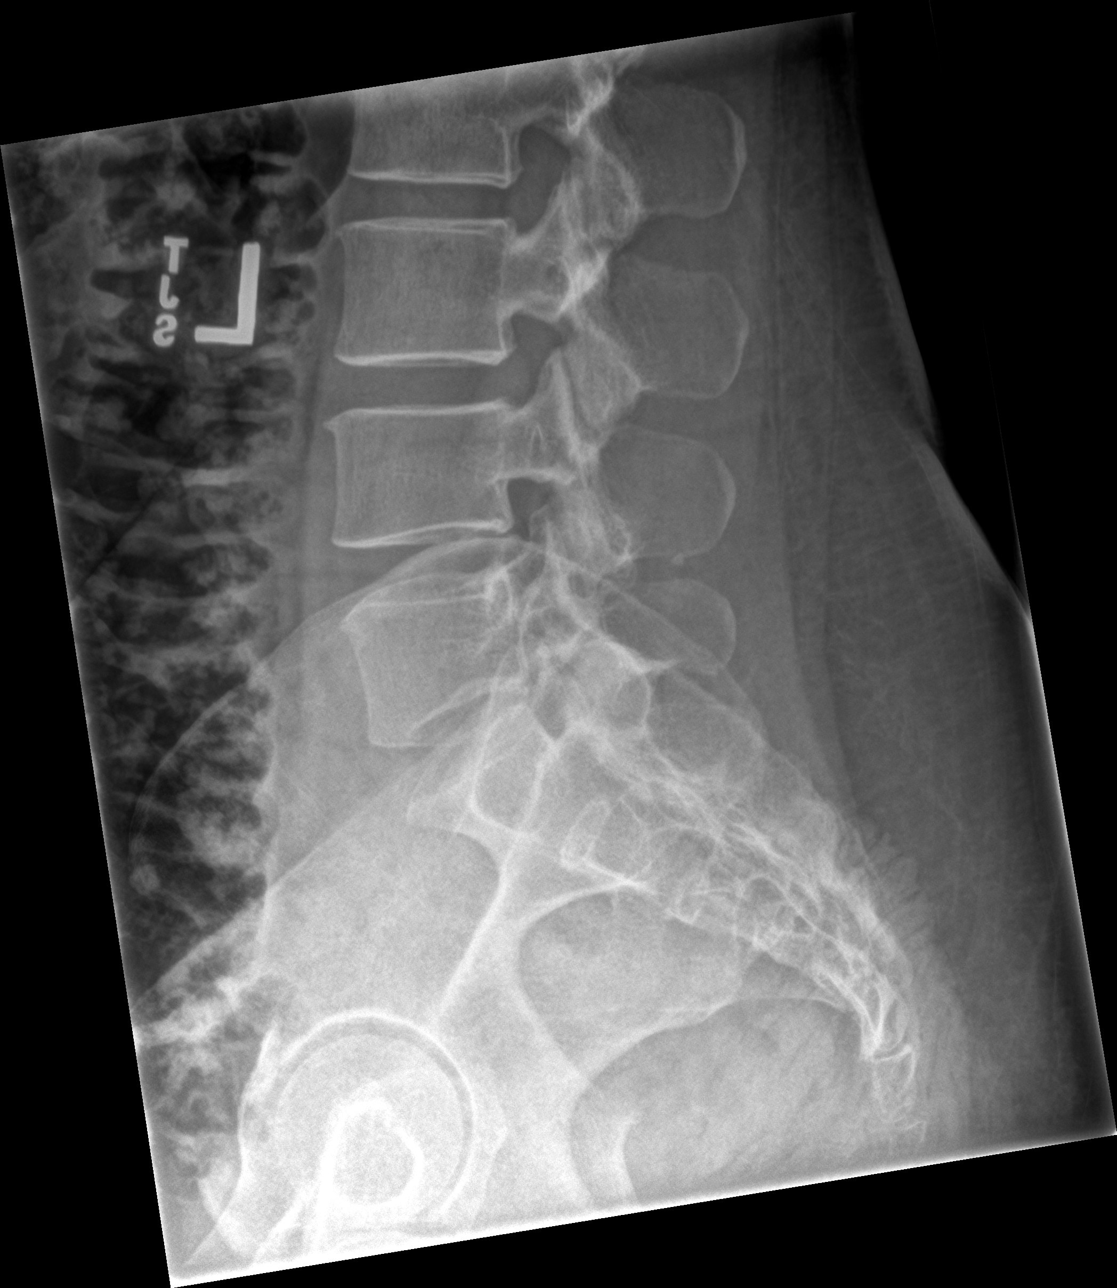

[5 of 5 positions shown; findings below may reference images not displayed]

FINDINGS: Limited limited evaluation due to overlapping osseous structures and
overlying soft tissues.

Five non-rib-bearing lumbar vertebral bodies.

There is no evidence of lumbar spine fracture. Alignment is normal.
Facet arthropathy intervertebral disc space narrowing at the L5-S1
level.
IMPRESSION: No acute displaced fracture or traumatic listhesis of the lumbar
spine. Limited evaluation due to overlapping osseous structures and
overlying soft tissues.

## 2023-08-29 ENCOUNTER — Telehealth: Payer: Self-pay | Admitting: *Deleted

## 2023-08-29 ENCOUNTER — Other Ambulatory Visit: Payer: Self-pay | Admitting: *Deleted

## 2023-08-29 DIAGNOSIS — Z1211 Encounter for screening for malignant neoplasm of colon: Secondary | ICD-10-CM

## 2023-08-29 MED ORDER — NA SULFATE-K SULFATE-MG SULF 17.5-3.13-1.6 GM/177ML PO SOLN: 1.0000 | Freq: Once | ORAL | 0 refills | Status: AC

## 2023-08-29 NOTE — Telephone Encounter (Signed)
Gastroenterology Pre-Procedure Review  Request Date: 09/15/2023 Requesting Physician: Dr. Tobi Bastos  PATIENT REVIEW QUESTIONS: The patient responded to the following health history questions as indicated:    1. Are you having any GI issues? no 2. Do you have a personal history of Polyps? no 3. Do you have a family history of Colon Cancer or Polyps? no 4. Diabetes Mellitus? no 5. Joint replacements in the past 12 months?no 6. Major health problems in the past 3 months?no 7. Any artificial heart valves, MVP, or defibrillator?no    MEDICATIONS & ALLERGIES:    Patient reports the following regarding taking any anticoagulation/antiplatelet therapy:   Plavix, Coumadin, Eliquis, Xarelto, Lovenox, Pradaxa, Brilinta, or Effient? no Aspirin? no  Patient confirms/reports the following medications:  Current Outpatient Medications  Medication Sig Dispense Refill   aluminum chloride (DRYSOL) 20 % external solution Apply topically at bedtime. 35 mL 11   Fluocinolone Acetonide Body 0.01 % OIL Apply to scalp and leave on once to twice daily as needed. 120 mL 11   ketoconazole (NIZORAL) 2 % shampoo Massage into scalp, let sit several minutes before rinsing. 120 mL 11   pimecrolimus (ELIDEL) 1 % cream Apply twice daily to affected itchy areas on neck 30 g 2   No current facility-administered medications for this visit.    Patient confirms/reports the following allergies:  Allergies  Allergen Reactions   Sulfa Antibiotics     Mouth lips swell   Latex     Rash hives    Penicillins Hives   Penicillins    Shellfish Allergy Nausea Only   Shellfish Allergy     Itching   Sulfasalazine Nausea And Vomiting   Latex Rash   Sulfa Antibiotics Nausea And Vomiting and Rash    No orders of the defined types were placed in this encounter.   AUTHORIZATION INFORMATION Primary Insurance: 1D#: Group #:  Secondary Insurance: 1D#: Group #:  SCHEDULE INFORMATION: Date: 09/15/2023 Time: Location:   [facility]

## 2023-09-15 ENCOUNTER — Ambulatory Visit
Admission: RE | Admit: 2023-09-15 | Discharge: 2023-09-15 | Disposition: A | Discharge status: Home / Self Care | Payer: Managed Care, Other (non HMO) | Attending: Gastroenterology | Admitting: Gastroenterology

## 2023-09-15 ENCOUNTER — Encounter
Admission: RE | Disposition: A | Discharge status: Home / Self Care | Payer: Self-pay | Source: Home / Self Care | Attending: Gastroenterology

## 2023-09-15 ENCOUNTER — Other Ambulatory Visit: Payer: Self-pay

## 2023-09-15 ENCOUNTER — Ambulatory Visit: Payer: Managed Care, Other (non HMO) | Admitting: Certified Registered"

## 2023-09-15 ENCOUNTER — Encounter: Payer: Self-pay | Admitting: Gastroenterology

## 2023-09-15 DIAGNOSIS — D124 Benign neoplasm of descending colon: Secondary | ICD-10-CM | POA: Diagnosis not present

## 2023-09-15 DIAGNOSIS — Z1211 Encounter for screening for malignant neoplasm of colon: Secondary | ICD-10-CM

## 2023-09-15 DIAGNOSIS — K219 Gastro-esophageal reflux disease without esophagitis: Secondary | ICD-10-CM | POA: Insufficient documentation

## 2023-09-15 DIAGNOSIS — D122 Benign neoplasm of ascending colon: Secondary | ICD-10-CM | POA: Diagnosis not present

## 2023-09-15 DIAGNOSIS — D126 Benign neoplasm of colon, unspecified: Secondary | ICD-10-CM

## 2023-09-15 HISTORY — PX: COLONOSCOPY WITH PROPOFOL: SHX5780

## 2023-09-15 HISTORY — PX: HEMOSTASIS CLIP PLACEMENT: SHX6857

## 2023-09-15 HISTORY — PX: POLYPECTOMY: SHX5525

## 2023-09-15 SURGERY — COLONOSCOPY WITH PROPOFOL
Anesthesia: General

## 2023-09-15 MED ORDER — PROPOFOL 10 MG/ML IV BOLUS
INTRAVENOUS | Status: DC | PRN
  Administered 2023-09-15: 150 ug/kg/min via INTRAVENOUS
  Administered 2023-09-15: 100 mg via INTRAVENOUS

## 2023-09-15 MED ORDER — PROPOFOL 10 MG/ML IV BOLUS
INTRAVENOUS | Status: AC
  Filled 2023-09-15: qty 40

## 2023-09-15 MED ORDER — LIDOCAINE HCL (PF) 2 % IJ SOLN
INTRAMUSCULAR | Status: AC
  Filled 2023-09-15: qty 5

## 2023-09-15 MED ORDER — SODIUM CHLORIDE 0.9 % IV SOLN: INTRAVENOUS | Status: DC

## 2023-09-15 MED ORDER — LIDOCAINE HCL (PF) 2 % IJ SOLN
INTRAMUSCULAR | Status: AC
Start: 2023-09-15 — End: ?
  Filled 2023-09-15: qty 5

## 2023-09-15 MED ORDER — LIDOCAINE HCL (CARDIAC) PF 100 MG/5ML IV SOSY
PREFILLED_SYRINGE | INTRAVENOUS | Status: DC | PRN
  Administered 2023-09-15: 100 mg via INTRAVENOUS

## 2023-09-15 NOTE — Transfer of Care (Signed)
Immediate Anesthesia Transfer of Care Note  Patient: Christine Fry  Procedure(s) Performed: COLONOSCOPY WITH PROPOFOL POLYPECTOMY HEMOSTASIS CLIP PLACEMENT  Patient Location: Endoscopy Unit  Anesthesia Type:General  Level of Consciousness: awake, alert , and oriented  Airway & Oxygen Therapy: Patient Spontanous Breathing  Post-op Assessment: Report given to RN and Post -op Vital signs reviewed and stable  Post vital signs: Reviewed and stable  Last Vitals:  Vitals Value Taken Time  BP 111/92 09/15/23 0921  Temp 36.1 0921  Pulse 84 09/15/23 0922  Resp 17 09/15/23 0922  SpO2 100 % 09/15/23 0922  Vitals shown include unfiled device data.  Last Pain:  Vitals:   09/15/23 0833  TempSrc: Temporal  PainSc: 0-No pain         Complications: No notable events documented.

## 2023-09-15 NOTE — Op Note (Signed)
[facility] Gastroenterology Patient Name: Christine Fry Procedure Date: 09/15/2023 8:57 AM MRN: [id number] Account #: [id number] Date of Birth: 1976/07/28 Admit Type: Outpatient Age: 48 Room: [facility] ENDO ROOM 2 Gender: Female Note Status: Finalized Instrument Name: Prentice Docker [id number] Procedure:             Colonoscopy Indications:           Screening for colorectal malignant neoplasm Providers:             Wyline Mood MD, MD Referring MD:          Larae Grooms (Referring MD) Medicines:             Monitored Anesthesia Care Complications:         No immediate complications. Procedure:             Pre-Anesthesia Assessment:                        - Prior to the procedure, a History and Physical was                         performed, and patient medications, allergies and                         sensitivities were reviewed. The patient's tolerance                         of previous anesthesia was reviewed.                        - The risks and benefits of the procedure and the                         sedation options and risks were discussed with the                         patient. All questions were answered and informed                         consent was obtained.                        - ASA Grade Assessment: II - A patient with mild                         systemic disease.                        After obtaining informed consent, the colonoscope was                         passed under direct vision. Throughout the procedure,                         the patient's blood pressure, pulse, and oxygen                         saturations were monitored continuously. The                         Colonoscope was introduced through  the anus and                         advanced to the the cecum, identified by the                         appendiceal orifice. The colonoscopy was performed                         without difficulty. The patient tolerated the                          procedure well. The quality of the bowel preparation                         was excellent. The ileocecal valve, appendiceal                         orifice, and rectum were photographed. Findings:      The perianal and digital rectal examinations were normal.      Three sessile polyps were found in the ascending colon. The polyps were       5 to 9 mm in size. These polyps were removed with a cold snare.       Resection and retrieval were complete. To prevent bleeding       post-intervention, one hemostatic clip was successfully placed. Clip       manufacturer: [employer]. There was no bleeding at the end of the       procedure.      A 7 mm polyp was found in the descending colon. The polyp was sessile.       The polyp was removed with a cold snare. Resection and retrieval were       complete.      The exam was otherwise without abnormality on direct and retroflexion       views. Impression:            - Three 5 to 9 mm polyps in the ascending colon,                         removed with a cold snare. Resected and retrieved.                         Clip was placed. Clip manufacturer: [employer].                        - One 7 mm polyp in the descending colon, removed with                         a cold snare. Resected and retrieved.                        - The examination was otherwise normal on direct and                         retroflexion views. Recommendation:        - Discharge patient to home (with escort).                        -  Resume previous diet.                        - Continue present medications.                        - Await pathology results.                        - Repeat colonoscopy in 3 years for surveillance. Procedure Code(s):     --- Professional ---                        [phone], Colonoscopy, flexible; with removal of                         tumor(s), polyp(s), or other lesion(s) by snare                          technique Diagnosis Code(s):     --- Professional ---                        Z12.11, Encounter for screening for malignant neoplasm                         of colon                        D12.2, Benign neoplasm of ascending colon                        D12.4, Benign neoplasm of descending colon CPT copyright 2022 American Medical Association. All rights reserved. The codes documented in this report are preliminary and upon coder review may  be revised to meet current compliance requirements. Wyline Mood, MD Wyline Mood MD, MD 09/15/2023 9:22:05 AM This report has been signed electronically. Number of Addenda: 0 Note Initiated On: 09/15/2023 8:57 AM Scope Withdrawal Time: 0 hours 14 minutes 6 seconds  Total Procedure Duration: 0 hours 18 minutes 27 seconds  Estimated Blood Loss:  Estimated blood loss: none.      [facility]

## 2023-09-15 NOTE — Anesthesia Postprocedure Evaluation (Signed)
Anesthesia Post Note  Patient: Christine Fry  Procedure(s) Performed: COLONOSCOPY WITH PROPOFOL POLYPECTOMY HEMOSTASIS CLIP PLACEMENT  Patient location during evaluation: Endoscopy Anesthesia Type: General Level of consciousness: awake and alert Pain management: pain level controlled Vital Signs Assessment: post-procedure vital signs reviewed and stable Respiratory status: spontaneous breathing, nonlabored ventilation, respiratory function stable and patient connected to nasal cannula oxygen Cardiovascular status: blood pressure returned to baseline and stable Postop Assessment: no apparent nausea or vomiting Anesthetic complications: no   No notable events documented.   Last Vitals:  Vitals:   09/15/23 0931 09/15/23 0941  BP: 120/88 (!) 124/90  Pulse: 80 77  Resp: 20 14  Temp:    SpO2: 100% 100%    Last Pain:  Vitals:   09/15/23 0941  TempSrc:   PainSc: 0-No pain                 Lenard Simmer

## 2023-09-15 NOTE — Anesthesia Preprocedure Evaluation (Signed)
Anesthesia Evaluation  Patient identified by MRN, date of birth, ID band Patient awake    Reviewed: Allergy & Precautions, H&P , NPO status , Patient's Chart, lab work & pertinent test results, reviewed documented beta blocker date and time   History of Anesthesia Complications Negative for: history of anesthetic complications  Airway Mallampati: I  TM Distance: >3 FB Neck ROM: full    Dental  (+) Dental Advidsory Given, Caps, Teeth Intact   Pulmonary neg pulmonary ROS   Pulmonary exam normal breath sounds clear to auscultation       Cardiovascular Exercise Tolerance: Good negative cardio ROS Normal cardiovascular exam Rhythm:regular Rate:Normal     Neuro/Psych negative neurological ROS  negative psych ROS   GI/Hepatic negative GI ROS, Neg liver ROS,,,  Endo/Other  negative endocrine ROS    Renal/GU negative Renal ROS  negative genitourinary   Musculoskeletal   Abdominal   Peds  Hematology negative hematology ROS (+)   Anesthesia Other Findings Past Medical History: No date: Acid reflux No date: Allergy     Comment:  used to get allergy shots with ENT last in 2015 No date: Endometriosis No date: Environmental allergies 10/2017: Family history of breast cancer     Comment:  cancer genetic testing letter sent 10/14/2011: History of abnormal cervical Pap smear     Comment:  LGSIL No date: HLD (hyperlipidemia) 01/03/2013: Nipple discharge in female   Reproductive/Obstetrics negative OB ROS                             Anesthesia Physical Anesthesia Plan  ASA: 1  Anesthesia Plan: General   Post-op Pain Management:    Induction: Intravenous  PONV Risk Score and Plan: 3 and Propofol infusion and TIVA  Airway Management Planned: Natural Airway and Nasal Cannula  Additional Equipment:   Intra-op Plan:   Post-operative Plan:   Informed Consent: I have reviewed the patients  History and Physical, chart, labs and discussed the procedure including the risks, benefits and alternatives for the proposed anesthesia with the patient or authorized representative who has indicated his/her understanding and acceptance.     Dental Advisory Given  Plan Discussed with: Anesthesiologist, CRNA and Surgeon  Anesthesia Plan Comments:        Anesthesia Quick Evaluation

## 2023-09-15 NOTE — H&P (Signed)
Wyline Mood, MD [address], [place], [place], [address] [address], [place], [place], [address] Phone: [phone]  Fax: [phone]  Primary Care Physician:  Larae Grooms, NP   Pre-Procedure History & Physical: HPI:  Christine Fry is a 48 y.o. female is here for an colonoscopy.   Past Medical History:  Diagnosis Date   Acid reflux    Allergy    used to get allergy shots with ENT last in 2015   Endometriosis    Environmental allergies    Family history of breast cancer 10/2017   cancer genetic testing letter sent   History of abnormal cervical Pap smear 10/14/2011   LGSIL   HLD (hyperlipidemia)    Nipple discharge in female 01/03/2013    Past Surgical History:  Procedure Laterality Date   BILATERAL SALPINGOOPHORECTOMY  2010   with LSH by Dr Luella Cook for [facility] and osis   BREAST SURGERY     left breat in 2015/2016 clogged milk duct   BREAST SURGERY Left 11/14/14   excision   CESAREAN SECTION     x1   CESAREAN SECTION  2005   COLPOSCOPY  2013   LAPAROSCOPIC SUPRACERVICAL HYSTERECTOMY  07/16/2009   LSH/BSO for leiomyoma and adenomyosis Dr Luella Cook    Prior to Admission medications   Medication Sig Start Date End Date Taking? Authorizing Provider  aluminum chloride (DRYSOL) 20 % external solution Apply topically at bedtime. 08/10/22   [name], MD  Fluocinolone Acetonide Body 0.01 % OIL Apply to scalp and leave on once to twice daily as needed. 08/10/22   [name], MD  ketoconazole (NIZORAL) 2 % shampoo Massage into scalp, let sit several minutes before rinsing. 08/10/22   [name], MD  pimecrolimus (ELIDEL) 1 % cream Apply twice daily to affected itchy areas on neck 02/20/23   [name], MD    Allergies as of 08/29/2023 - Review Complete 08/10/2023  Allergen Reaction Noted   Sulfa antibiotics  02/13/2019   Latex  02/13/2019   Penicillins Hives 04/23/2018   Penicillins  02/13/2019   Shellfish allergy Nausea Only  01/02/2013   Shellfish allergy  02/13/2019   Sulfasalazine Nausea And Vomiting 01/02/2013   Latex Rash 01/02/2013   Sulfa antibiotics Nausea And Vomiting and Rash 01/02/2013    Family History  Problem Relation Age of Onset   Diabetes Father    Hypertension Father    Stroke Father    Hyperlipidemia Father    Breast cancer Paternal Aunt 75   Diabetes Paternal Grandmother    Diabetes Paternal Grandfather    Diabetes Maternal Aunt        several aunts with diabetes and HTN   Hypertension Maternal Aunt    Diabetes Maternal Uncle        several uncles with diabetes and HTN   Hypertension Maternal Uncle    Sickle cell anemia Maternal Aunt    Aneurysm Father        brain   Hypertension Maternal Grandfather    Diabetes Maternal Aunt        x 3     Social History   Socioeconomic History   Marital status: Significant Other    Spouse name: Not on file   Number of children: 2   Years of education: 14   Highest education level: Not on file  Occupational History   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  Tobacco Use   Smoking status: Never   Smokeless tobacco: Never  Vaping Use   Vaping status: Never Used  Substance and Sexual Activity   Alcohol use: Yes    Comment: occasionally   Drug use: No   Sexual activity: Yes    Birth control/protection: Surgical  Other Topics Concern   Not on file  Social History Narrative   ** Merged History Encounter **       3 sons works age 23, 48 and 61 as of 02/13/2019   [place] since 2018  Never smoker  [employer]     Social Drivers of Health   Financial Resource Strain: Not on file  Food Insecurity: No Food Insecurity (07/10/2023)   Hunger Vital Sign    Worried About Running Out of Food in the Last Year: Never true    Ran Out of Food in the Last Year: Never true  Transportation Needs: No Transportation Needs (07/10/2023)   PRAPARE - Administrator, Civil Service (Medical): No    Lack of Transportation (Non-Medical): No  Physical  Activity: Sufficiently Active (10/06/2017)   Exercise Vital Sign    Days of Exercise per Week: 3 days    Minutes of Exercise per Session: 50 min  Stress: Stress Concern Present (10/06/2017)   [employer] of Occupational Health - Occupational Stress Questionnaire    Feeling of Stress : Rather much  Social Connections: Unknown (07/10/2023)   Social Connection and Isolation Panel [NHANES]    Frequency of Communication with Friends and Family: Once a week    Frequency of Social Gatherings with Friends and Family: Once a week    Attends Religious Services: Not on Insurance claims handler of Clubs or Organizations: Not on file    Attends Banker Meetings: Not on file    Marital Status: Not on file  Intimate Partner Violence: Not At Risk (07/10/2023)   Humiliation, Afraid, Rape, and Kick questionnaire    Fear of Current or Ex-Partner: No    Emotionally Abused: No    Physically Abused: No    Sexually Abused: No    Review of Systems: See HPI, otherwise negative ROS  Physical Exam: LMP 06/11/2016 (Approximate)  General:   Alert,  pleasant and cooperative in NAD Head:  Normocephalic and atraumatic. Neck:  Supple; no masses or thyromegaly. Lungs:  Clear throughout to auscultation, normal respiratory effort.    Heart:  +S1, +S2, Regular rate and rhythm, No edema. Abdomen:  Soft, nontender and nondistended. Normal bowel sounds, without guarding, and without rebound.   Neurologic:  Alert and  oriented x4;  grossly normal neurologically.  Impression/Plan: Christine Fry is here for an colonoscopy to be performed for Screening colonoscopy average risk   Risks, benefits, limitations, and alternatives regarding  colonoscopy have been reviewed with the patient.  Questions have been answered.  All parties agreeable.   Wyline Mood, MD  09/15/2023, 8:06 AM

## 2023-09-18 ENCOUNTER — Encounter: Payer: Self-pay | Admitting: Gastroenterology

## 2023-09-18 LAB — SURGICAL PATHOLOGY

## 2023-10-17 ENCOUNTER — Ambulatory Visit: Payer: PRIVATE HEALTH INSURANCE | Admitting: Dermatology

## 2023-10-25 ENCOUNTER — Ambulatory Visit: Payer: PRIVATE HEALTH INSURANCE | Admitting: Dermatology

## 2023-10-31 ENCOUNTER — Ambulatory Visit: Payer: PRIVATE HEALTH INSURANCE | Admitting: Dermatology

## 2023-12-04 ENCOUNTER — Telehealth (INDEPENDENT_AMBULATORY_CARE_PROVIDER_SITE_OTHER): Payer: Self-pay | Admitting: Vascular Surgery

## 2023-12-04 NOTE — Telephone Encounter (Signed)
 LVM for pt advising that we have all supplies now and are ready to start scheduling laser ablations again. I do however need her insurance to obtain a prior [place]. I asked for a return phone call advising of insurance information.

## 2023-12-19 ENCOUNTER — Encounter (INDEPENDENT_AMBULATORY_CARE_PROVIDER_SITE_OTHER): Payer: Self-pay

## 2024-01-10 ENCOUNTER — Other Ambulatory Visit: Payer: Self-pay | Admitting: Dermatology

## 2024-01-10 DIAGNOSIS — L219 Seborrheic dermatitis, unspecified: Secondary | ICD-10-CM

## 2024-02-05 ENCOUNTER — Other Ambulatory Visit: Payer: Self-pay | Admitting: Nurse Practitioner

## 2024-02-05 DIAGNOSIS — Z1231 Encounter for screening mammogram for malignant neoplasm of breast: Secondary | ICD-10-CM

## 2024-02-28 ENCOUNTER — Ambulatory Visit
Admission: RE | Admit: 2024-02-28 | Discharge: 2024-02-28 | Disposition: A | Discharge status: Home / Self Care | Payer: Self-pay | Source: Ambulatory Visit | Attending: Nurse Practitioner | Admitting: Nurse Practitioner

## 2024-02-28 DIAGNOSIS — Z1231 Encounter for screening mammogram for malignant neoplasm of breast: Secondary | ICD-10-CM | POA: Diagnosis present

## 2024-03-08 ENCOUNTER — Encounter: Payer: Self-pay | Admitting: Obstetrics and Gynecology

## 2024-03-13 ENCOUNTER — Ambulatory Visit: Payer: Self-pay | Admitting: Obstetrics and Gynecology

## 2024-03-13 DIAGNOSIS — Z01419 Encounter for gynecological examination (general) (routine) without abnormal findings: Secondary | ICD-10-CM

## 2024-03-13 DIAGNOSIS — N951 Menopausal and female climacteric states: Secondary | ICD-10-CM

## 2024-03-20 ENCOUNTER — Ambulatory Visit: Payer: Self-pay | Admitting: Dermatology

## 2024-04-10 ENCOUNTER — Ambulatory Visit: Payer: Self-pay | Admitting: Dermatology

## 2024-04-10 ENCOUNTER — Ambulatory Visit: Admitting: Dermatology

## 2024-04-10 DIAGNOSIS — L219 Seborrheic dermatitis, unspecified: Secondary | ICD-10-CM | POA: Diagnosis not present

## 2024-04-10 DIAGNOSIS — L821 Other seborrheic keratosis: Secondary | ICD-10-CM

## 2024-04-10 DIAGNOSIS — L820 Inflamed seborrheic keratosis: Secondary | ICD-10-CM | POA: Diagnosis not present

## 2024-04-10 DIAGNOSIS — L819 Disorder of pigmentation, unspecified: Secondary | ICD-10-CM | POA: Diagnosis not present

## 2024-04-10 DIAGNOSIS — L74519 Primary focal hyperhidrosis, unspecified: Secondary | ICD-10-CM | POA: Diagnosis not present

## 2024-04-10 DIAGNOSIS — L905 Scar conditions and fibrosis of skin: Secondary | ICD-10-CM

## 2024-04-10 MED ORDER — ALUMINUM CHLORIDE 20 % EX SOLN: Freq: Every day | CUTANEOUS | 11 refills | Status: AC

## 2024-04-10 MED ORDER — MUPIROCIN 2 % EX OINT: TOPICAL_OINTMENT | CUTANEOUS | 1 refills | Status: AC

## 2024-04-10 MED ORDER — FLUOCINOLONE ACETONIDE BODY 0.01 % EX OIL: TOPICAL_OIL | CUTANEOUS | 11 refills | Status: AC

## 2024-04-10 MED ORDER — KETOCONAZOLE 2 % EX SHAM: MEDICATED_SHAMPOO | CUTANEOUS | 11 refills | Status: AC

## 2024-04-10 NOTE — Patient Instructions (Addendum)

## 2024-04-10 NOTE — Progress Notes (Signed)
 Follow-Up Visit   Subjective  Christine Fry is a 48 y.o. female who presents for the following: history of bump on nose around 6 weeks ago, area got a dark scab the next day, then peeled off and pus was present. She pulled off the scab and it left a deep hole on her nose which has now healed. She had similar spots on her forehead and cheek, but didn't scar like the nose did. No recent illness, no other sores in the nose or mouth. She typically does not get acne bumps.  She also has an irritated spot under her R arm, and needs rfs for  Rx shampoo and oil for Seb derm.  Also rfs for hyperhidrosis- uses drysol 1-2x weekly without skin irritation.  The following portions of the chart were reviewed this encounter and updated as appropriate: medications, allergies, medical history  Review of Systems:  No other skin or systemic complaints except as noted in HPI or Assessment and Plan.  Objective  Well appearing patient in no apparent distress; mood and affect are within normal limits.  A focused examination was performed of the following areas: Face  Relevant physical exam findings are noted in the Assessment and Plan.  R ant axilla x 1 Erythematous stuck-on, waxy papule or plaque  Assessment & Plan   Scar due to probable abscess, resolved  Exam: Hyperpigmented smooth depressed macule mid nasal dorsum  Benign-appearing.  Observation.  Call clinic for new or changing lesions. Recommend daily broad spectrum sunscreen SPF 30+, reapply every 2 hours as needed. Scars remodel on their own for a full year and will gradually improve in appearance over time.  Start mupirocin  2% ointment apply in nares BID x 2 weeks  Patient to return to office if recurs or other similar lesion comes up, may need oral antibiotic and/or culture.  PRIMARY FOCAL HYPERHIDROSIS   Related Medications aluminum  chloride (DRYSOL) 20 % external solution Apply topically at bedtime. SEBORRHEIC DERMATITIS   Related  Medications ketoconazole  (NIZORAL ) 2 % shampoo Massage into scalp, let sit several minutes before rinsing. Use 2x/month. Fluocinolone  Acetonide Body 0.01 % OIL Apply to scalp and leave on once to twice daily as needed. INFLAMED SEBORRHEIC KERATOSIS R ant axilla x 1 Symptomatic, irritating, patient would like treated. Destruction of lesion - R ant axilla x 1  Destruction method: cryotherapy   Informed consent: discussed and consent obtained   Lesion destroyed using liquid nitrogen: Yes   Region frozen until ice ball extended beyond lesion: Yes   Outcome: patient tolerated procedure well with no complications   Post-procedure details: wound care instructions given   Additional details:  Prior to procedure, discussed risks of blister formation, small wound, skin dyspigmentation, or rare scar following cryotherapy. Recommend Vaseline ointment to treated areas while healing.   Related Medications pimecrolimus  (ELIDEL ) 1 % cream Apply twice daily to affected itchy areas on neck  SEBORRHEIC DERMATITIS Exam: Mild erythema and scale at left occipital scalp  Chronic and persistent condition with duration or expected duration over one year. Condition is symptomatic/ bothersome to patient. Not currently at goal.   Seborrheic Dermatitis is a chronic persistent rash characterized by pinkness and scaling most commonly of the mid face but also can occur on the scalp (dandruff), ears; mid chest, mid back and groin.  It tends to be exacerbated by stress and cooler weather.  People who have neurologic disease may experience new onset or exacerbation of existing seborrheic dermatitis.  The condition is not curable  but treatable and can be controlled.  Treatment Plan: Cont Ketoconazole  2% shampoo apply to scalp once a week as directed prn 1 yr Rf. Cont Fluocinolone  scalp oil apply to scalp twice a day prn itch,irritation. Use as directed. 1 yr Rf.    Primary focal hyperhidrosis Exam: bil axilla with  moisture   Chronic and persistent condition with duration or expected duration over one year. Condition is symptomatic / bothersome to patient. Not to goal since out of Drysol.  Needs rfs   Treatment Plan:  Cont Drysol apply to clean dry axiilae at bedtime as tolerated. Pt uses twice a week.  1 yr Rf. May cont wearing otc Deodorant on alternate days   SEBORRHEIC KERATOSIS/DPN - Tiny stuck-on, waxy, tan-brown papules neck - Benign-appearing - Discussed benign etiology and prognosis. - Observe - Call for any changes - Plan cosmetic ED to neck on follow-up.   Return for ED to cosmetic SKs on neck.  IAndrea Kerns, CMA, am acting as scribe for Rexene Rattler, MD .   Documentation: I have reviewed the above documentation for accuracy and completeness, and I agree with the above.  Rexene Rattler, MD

## 2024-05-20 ENCOUNTER — Ambulatory Visit: Admitting: Dermatology

## 2024-05-20 DIAGNOSIS — L821 Other seborrheic keratosis: Secondary | ICD-10-CM

## 2024-05-20 DIAGNOSIS — L820 Inflamed seborrheic keratosis: Secondary | ICD-10-CM

## 2024-05-20 DIAGNOSIS — L905 Scar conditions and fibrosis of skin: Secondary | ICD-10-CM

## 2024-05-20 DIAGNOSIS — L810 Postinflammatory hyperpigmentation: Secondary | ICD-10-CM | POA: Diagnosis not present

## 2024-05-20 NOTE — Patient Instructions (Addendum)
 Nothing looks abnormal inside nose today but if you feel needs to be checked further recommend seeing a Ear Nose and Throat doctor    Seborrheic Keratosis  What causes seborrheic keratoses? Seborrheic keratoses are harmless, common skin growths that first appear during adult life.  As time goes by, more growths appear.  Some people may develop a large number of them.  Seborrheic keratoses appear on both covered and uncovered body parts.  They are not caused by sunlight.  The tendency to develop seborrheic keratoses can be inherited.  They vary in color from skin-colored to gray, brown, or even black.  They can be either smooth or have a rough, warty surface.   Seborrheic keratoses are superficial and look as if they were stuck on the skin.  Under the microscope this type of keratosis looks like layers upon layers of skin.  That is why at times the top layer may seem to fall off, but the rest of the growth remains and re-grows.    Treatment Seborrheic keratoses do not need to be treated, but can easily be removed in the office.  Seborrheic keratoses often cause symptoms when they rub on clothing or jewelry.  Lesions can be in the way of shaving.  If they become inflamed, they can cause itching, soreness, or burning.  Removal of a seborrheic keratosis can be accomplished by freezing, burning, or surgery. If any spot bleeds, scabs, or grows rapidly, please return to have it checked, as these can be an indication of a skin cancer.   Due to recent changes in healthcare laws, you may see results of your pathology and/or laboratory studies on MyChart before the doctors have had a chance to review them. We understand that in some cases there may be results that are confusing or concerning to you. Please understand that not all results are received at the same time and often the doctors may need to interpret multiple results in order to provide you with the best plan of care or course of treatment. Therefore,  we ask that you please give us  2 business days to thoroughly review all your results before contacting the office for clarification. Should we see a critical lab result, you will be contacted sooner.   If You Need Anything After Your Visit  If you have any questions or concerns for your doctor, please call our main line at [phone] and press option 4 to reach your doctor's medical assistant. If no one answers, please leave a voicemail as directed and we will return your call as soon as possible. Messages left after 4 pm will be answered the following business day.   You may also send us  a message via MyChart. We typically respond to MyChart messages within 1-2 business days.  For prescription refills, please ask your pharmacy to contact our office. Our fax number is [phone].  If you have an urgent issue when the clinic is closed that cannot wait until the next business day, you can page your doctor at the number below.    Please note that while we do our best to be available for urgent issues outside of office hours, we are not available 24/7.   If you have an urgent issue and are unable to reach us , you may choose to seek medical care at your doctor's office, retail clinic, urgent care center, or emergency room.  If you have a medical emergency, please immediately call 911 or go to the emergency department.  Pager Numbers  -  Dr. Hester: [phone]  - Dr. Jackquline: [phone]  - Dr. Claudene: [phone]   - Dr. Raymund: [phone]  In the event of inclement weather, please call our main line at [phone] for an update on the status of any delays or closures.  Dermatology Medication Tips: Please keep the boxes that topical medications come in in order to help keep track of the instructions about where and how to use these. Pharmacies typically print the medication instructions only on the boxes and not directly on the medication tubes.   If your medication is too  expensive, please contact our office at [phone] option 4 or send us  a message through MyChart.   We are unable to tell what your co-pay for medications will be in advance as this is different depending on your insurance coverage. However, we may be able to find a substitute medication at lower cost or fill out paperwork to get insurance to cover a needed medication.   If a prior authorization is required to get your medication covered by your insurance company, please allow us  1-2 business days to complete this process.  Drug prices often vary depending on where the prescription is filled and some pharmacies may offer cheaper prices.  The website www.goodrx.com contains coupons for medications through different pharmacies. The prices here do not account for what the cost may be with help from insurance (it may be cheaper with your insurance), but the website can give you the price if you did not use any insurance.  - You can print the associated coupon and take it with your prescription to the pharmacy.  - You may also stop by our office during regular business hours and pick up a GoodRx coupon card.  - If you need your prescription sent electronically to a different pharmacy, notify our office through [facility] or by phone at [phone] option 4.     Si Usted Necesita Algo Despus de Su Visita  Tambin puede enviarnos un mensaje a travs de Clinical cytogeneticist. Por lo general respondemos a los mensajes de MyChart en el transcurso de 1 a 2 das hbiles.  Para renovar recetas, por favor pida a su farmacia que se ponga en contacto con nuestra oficina. Randi lakes de fax es [place] [phone].  Si tiene un asunto urgente cuando la clnica est cerrada y que no puede esperar hasta el siguiente da hbil, puede llamar/localizar a su doctor(a) al nmero que aparece a continuacin.   Por favor, tenga en cuenta que aunque hacemos todo lo posible para estar disponibles para asuntos urgentes fuera  del horario de [place], no estamos disponibles las 24 horas del da, los 7 [address]  [address] de la [place].   Si tiene un problema urgente y no puede comunicarse con nosotros, puede optar por buscar atencin mdica  en el consultorio de su doctor(a), en una clnica privada, en un centro de atencin urgente o en una sala de emergencias.  Si tiene Engineer, drilling, por favor llame inmediatamente al 911 o vaya a la sala de emergencias.  Nmeros de bper  - Dr. Hester: [phone]  - Dra. Jackquline: [phone]  - Dr. Claudene: [phone]  - Dra. Kitts: [phone]  En caso de inclemencias del [place], por favor llame a nuestra lnea principal al [phone] para una actualizacin sobre el estado de cualquier retraso o cierre.  Consejos para la medicacin en dermatologa: Por favor, guarde las cajas en las que vienen los medicamentos de uso tpico para ayudarle a seguir las instrucciones sobre dnde  y cmo usarlos. Las farmacias generalmente imprimen las instrucciones del medicamento slo en las cajas y no directamente en los tubos del [place].   Si su medicamento es muy caro, por favor, pngase en contacto con landry rieger llamando al [phone] y presione la opcin 4 o envenos un mensaje a travs de Clinical cytogeneticist.   No podemos decirle cul ser su copago por los medicamentos por adelantado ya que esto es diferente dependiendo de la cobertura de su seguro. Sin embargo, es posible que podamos encontrar un medicamento sustituto a Audiological scientist un formulario para que el seguro cubra el medicamento que se considera necesario.   Si se requiere una autorizacin previa para que su compaa de seguros malta su medicamento, por favor permtanos de 1 a 2 das hbiles para completar este proceso.  Los precios de los medicamentos varan con frecuencia dependiendo del Environmental consultant de dnde se surte la receta y alguna farmacias pueden ofrecer precios ms baratos.  El sitio web www.goodrx.com tiene cupones  para medicamentos de Health and safety inspector. Los precios aqu no tienen en cuenta lo que podra costar con la ayuda del seguro (puede ser ms barato con su seguro), pero el sitio web puede darle el precio si no utiliz Tourist information centre manager.  - Puede imprimir el cupn correspondiente y llevarlo con su receta a la farmacia.  - Tambin puede pasar por nuestra oficina durante el horario de atencin regular y Education officer, museum una tarjeta de cupones de GoodRx.  - Si necesita que su receta se enve electrnicamente a una farmacia diferente, informe a nuestra oficina a travs de MyChart de [place] o por telfono llamando al [phone] y presione la opcin 4.

## 2024-05-20 NOTE — Progress Notes (Signed)
   Follow-Up Visit   Subjective  Christine Fry is a 48 y.o. female who presents for the following: patient here to treat itchy spots at neck- has used Elidel  cream in the past which hasn't helped,  recheck spot on nose and have inside of nose checked.   The following portions of the chart were reviewed this encounter and updated as appropriate: medications, allergies, medical history  Review of Systems:  No other skin or systemic complaints except as noted in HPI or Assessment and Plan.  Objective  Well appearing patient in no apparent distress; mood and affect are within normal limits.   A focused examination was performed of the following areas: Face, axilla b/l, nose, neck  Relevant exam findings are noted in the Assessment and Plan. Normal appearing nasal mucosa at L nare, slightly larger ridge inside nostril when compared to right.  Reassured pt looks normal.  But if bothersome could see ENT left neck x 23 (23) tiny stuck-on, waxy brown papules with crusting  Assessment & Plan   Scar due to probable abscess, resolved, with post-inflammatory hyperpimentation (PIH)    Exam: Hyperpigmented smooth depressed macule mid nasal dorsum   Benign-appearing.  Observation.  Call clinic for new or changing lesions. Recommend daily broad spectrum sunscreen SPF 30+, reapply every 2 hours as needed. Scars remodel on their own for a full year and will gradually improve in appearance over time.   SEBORRHEIC KERATOSIS/DPN - Tiny stuck-on, waxy, tan-brown papules neck - Benign-appearing - Discussed benign etiology and prognosis. - Observe - Call for any changes  INFLAMED SEBORRHEIC KERATOSIS (23) left neck x 23 (23) Symptomatic, irritating, patient would like treated.  Patient reports very itchy and irritated Destruction of lesion - left neck x 23 (23)  Destruction method: electrodesiccation and curettage   Destruction method comment:  Electrodesiccation only, not curretage Informed  consent: discussed and consent obtained   Outcome: patient tolerated procedure well with no complications   Post-procedure details: wound care instructions given    Related Medications pimecrolimus  (ELIDEL ) 1 % cream Apply twice daily to affected itchy areas on neck SCAR   POST-INFLAMMATORY HYPERPIGMENTATION   SEBORRHEIC KERATOSIS    Return if symptoms worsen or fail to improve, for schedule for treatment of isks at right neck.  I, Eleanor Blush, CMA, am acting as scribe for Rexene Rattler, MD.   Documentation: I have reviewed the above documentation for accuracy and completeness, and I agree with the above.  Rexene Rattler, MD

## 2024-07-09 ENCOUNTER — Ambulatory Visit: Admitting: Dermatology

## 2024-07-29 ENCOUNTER — Ambulatory Visit: Admitting: Dermatology

## 2024-08-23 ENCOUNTER — Other Ambulatory Visit: Payer: Self-pay

## 2024-08-26 ENCOUNTER — Other Ambulatory Visit: Payer: Self-pay

## 2024-09-06 ENCOUNTER — Ambulatory Visit (LOCAL_COMMUNITY_HEALTH_CENTER): Payer: Self-pay

## 2024-09-06 DIAGNOSIS — Z111 Encounter for screening for respiratory tuberculosis: Secondary | ICD-10-CM

## 2024-09-09 ENCOUNTER — Other Ambulatory Visit: Payer: Self-pay
